# Patient Record
Sex: Male | Born: 1970
Health system: Southern US, Community
[De-identification: ages and names within clinical notes are randomized; demographics above are authoritative.]

## PROBLEM LIST (undated history)

## (undated) DIAGNOSIS — R918 Other nonspecific abnormal finding of lung field: Secondary | ICD-10-CM

## (undated) DIAGNOSIS — N200 Calculus of kidney: Secondary | ICD-10-CM

## (undated) DIAGNOSIS — R161 Splenomegaly, not elsewhere classified: Secondary | ICD-10-CM

## (undated) DIAGNOSIS — D693 Immune thrombocytopenic purpura: Secondary | ICD-10-CM

## (undated) DIAGNOSIS — A419 Sepsis, unspecified organism: Secondary | ICD-10-CM

## (undated) DIAGNOSIS — D739 Disease of spleen, unspecified: Secondary | ICD-10-CM

## (undated) HISTORY — DX: Other disorders of bilirubin metabolism: E80.6

## (undated) HISTORY — DX: Disease of spleen, unspecified: D73.9

## (undated) HISTORY — DX: Splenomegaly, not elsewhere classified: R16.1

## (undated) HISTORY — DX: Immune thrombocytopenic purpura: D69.3

## (undated) HISTORY — DX: Other nonspecific abnormal finding of lung field: R91.8

## (undated) HISTORY — DX: Calculus of kidney: N20.0

## (undated) HISTORY — DX: Sepsis, unspecified organism: A41.9

---

## 2015-12-26 DIAGNOSIS — Z23 Encounter for immunization: Secondary | ICD-10-CM | POA: Diagnosis not present

## 2016-02-25 DIAGNOSIS — K611 Rectal abscess: Secondary | ICD-10-CM | POA: Diagnosis not present

## 2016-03-03 DIAGNOSIS — R509 Fever, unspecified: Secondary | ICD-10-CM | POA: Diagnosis not present

## 2016-03-03 DIAGNOSIS — R319 Hematuria, unspecified: Secondary | ICD-10-CM | POA: Diagnosis not present

## 2016-03-03 DIAGNOSIS — D696 Thrombocytopenia, unspecified: Secondary | ICD-10-CM | POA: Diagnosis not present

## 2016-03-04 ENCOUNTER — Inpatient Hospital Stay (HOSPITAL_COMMUNITY): Payer: BLUE CROSS/BLUE SHIELD

## 2016-03-04 ENCOUNTER — Inpatient Hospital Stay (HOSPITAL_COMMUNITY)
Admission: EM | Admit: 2016-03-04 | Discharge: 2016-03-08 | DRG: 872 | Disposition: A | Discharge status: Home / Self Care | Payer: BLUE CROSS/BLUE SHIELD | Attending: Family Medicine | Admitting: Family Medicine

## 2016-03-04 ENCOUNTER — Encounter (HOSPITAL_COMMUNITY): Payer: Self-pay | Admitting: Emergency Medicine

## 2016-03-04 DIAGNOSIS — D739 Disease of spleen, unspecified: Secondary | ICD-10-CM | POA: Diagnosis not present

## 2016-03-04 DIAGNOSIS — R509 Fever, unspecified: Secondary | ICD-10-CM | POA: Diagnosis present

## 2016-03-04 DIAGNOSIS — E876 Hypokalemia: Secondary | ICD-10-CM | POA: Diagnosis not present

## 2016-03-04 DIAGNOSIS — R7881 Bacteremia: Secondary | ICD-10-CM | POA: Diagnosis not present

## 2016-03-04 DIAGNOSIS — D696 Thrombocytopenia, unspecified: Secondary | ICD-10-CM | POA: Diagnosis not present

## 2016-03-04 DIAGNOSIS — R011 Cardiac murmur, unspecified: Secondary | ICD-10-CM | POA: Diagnosis not present

## 2016-03-04 DIAGNOSIS — Z91040 Latex allergy status: Secondary | ICD-10-CM

## 2016-03-04 DIAGNOSIS — I700 Atherosclerosis of aorta: Secondary | ICD-10-CM | POA: Diagnosis present

## 2016-03-04 DIAGNOSIS — K611 Rectal abscess: Secondary | ICD-10-CM

## 2016-03-04 DIAGNOSIS — Z23 Encounter for immunization: Secondary | ICD-10-CM | POA: Diagnosis not present

## 2016-03-04 DIAGNOSIS — D693 Immune thrombocytopenic purpura: Secondary | ICD-10-CM | POA: Diagnosis not present

## 2016-03-04 DIAGNOSIS — L0231 Cutaneous abscess of buttock: Secondary | ICD-10-CM | POA: Diagnosis not present

## 2016-03-04 DIAGNOSIS — D7389 Other diseases of spleen: Secondary | ICD-10-CM | POA: Diagnosis not present

## 2016-03-04 DIAGNOSIS — A419 Sepsis, unspecified organism: Principal | ICD-10-CM

## 2016-03-04 DIAGNOSIS — N200 Calculus of kidney: Secondary | ICD-10-CM | POA: Diagnosis not present

## 2016-03-04 DIAGNOSIS — R17 Unspecified jaundice: Secondary | ICD-10-CM | POA: Diagnosis not present

## 2016-03-04 DIAGNOSIS — D61818 Other pancytopenia: Secondary | ICD-10-CM | POA: Diagnosis present

## 2016-03-04 DIAGNOSIS — R161 Splenomegaly, not elsewhere classified: Secondary | ICD-10-CM | POA: Diagnosis not present

## 2016-03-04 DIAGNOSIS — D61811 Other drug-induced pancytopenia: Secondary | ICD-10-CM | POA: Diagnosis not present

## 2016-03-04 DIAGNOSIS — R502 Drug induced fever: Secondary | ICD-10-CM | POA: Diagnosis not present

## 2016-03-04 DIAGNOSIS — R935 Abnormal findings on diagnostic imaging of other abdominal regions, including retroperitoneum: Secondary | ICD-10-CM | POA: Diagnosis not present

## 2016-03-04 DIAGNOSIS — Z880 Allergy status to penicillin: Secondary | ICD-10-CM | POA: Diagnosis not present

## 2016-03-04 DIAGNOSIS — D72819 Decreased white blood cell count, unspecified: Secondary | ICD-10-CM | POA: Diagnosis not present

## 2016-03-04 LAB — LIPASE, BLOOD: Lipase: 43 U/L (ref 11–51)

## 2016-03-04 LAB — CBC WITH DIFFERENTIAL/PLATELET
BASOS ABS: 0 10*3/uL (ref 0.0–0.1)
BASOS PCT: 0 %
EOS ABS: 0 10*3/uL (ref 0.0–0.7)
Eosinophils Relative: 1 %
HEMATOCRIT: 33.5 % — AB (ref 39.0–52.0)
HEMOGLOBIN: 11.9 g/dL — AB (ref 13.0–17.0)
Lymphocytes Relative: 11 %
Lymphs Abs: 0.4 10*3/uL — ABNORMAL LOW (ref 0.7–4.0)
MCH: 28.1 pg (ref 26.0–34.0)
MCHC: 35.5 g/dL (ref 30.0–36.0)
MCV: 79.2 fL (ref 78.0–100.0)
Monocytes Absolute: 0.4 10*3/uL (ref 0.1–1.0)
Monocytes Relative: 13 %
NEUTROS ABS: 2.3 10*3/uL (ref 1.7–7.7)
NEUTROS PCT: 75 %
Platelets: 44 10*3/uL — ABNORMAL LOW (ref 150–400)
RBC: 4.23 MIL/uL (ref 4.22–5.81)
RDW: 15 % (ref 11.5–15.5)
WBC: 3.1 10*3/uL — AB (ref 4.0–10.5)

## 2016-03-04 LAB — URINE MICROSCOPIC-ADD ON

## 2016-03-04 LAB — COMPREHENSIVE METABOLIC PANEL
ALBUMIN: 2.8 g/dL — AB (ref 3.5–5.0)
ALK PHOS: 216 U/L — AB (ref 38–126)
ALT: 56 U/L (ref 17–63)
ANION GAP: 9 (ref 5–15)
AST: 36 U/L (ref 15–41)
BILIRUBIN TOTAL: 4.8 mg/dL — AB (ref 0.3–1.2)
BUN: 13 mg/dL (ref 6–20)
CALCIUM: 8.9 mg/dL (ref 8.9–10.3)
CO2: 21 mmol/L — AB (ref 22–32)
CREATININE: 1.11 mg/dL (ref 0.61–1.24)
Chloride: 102 mmol/L (ref 101–111)
GFR calc Af Amer: >60 mL/min (ref >60)
GFR calc non Af Amer: >60 mL/min (ref >60)
GLUCOSE: 129 mg/dL — AB (ref 65–99)
Potassium: 3.9 mmol/L (ref 3.5–5.1)
SODIUM: 132 mmol/L — AB (ref 135–145)
Total Protein: 6.1 g/dL — ABNORMAL LOW (ref 6.5–8.1)

## 2016-03-04 LAB — BILIRUBIN, FRACTIONATED(TOT/DIR/INDIR)
BILIRUBIN INDIRECT: 1.4 mg/dL — AB (ref 0.3–0.9)
Bilirubin, Direct: 2.9 mg/dL — ABNORMAL HIGH (ref 0.1–0.5)
Total Bilirubin: 4.3 mg/dL — ABNORMAL HIGH (ref 0.3–1.2)

## 2016-03-04 LAB — ACETAMINOPHEN LEVEL: Acetaminophen (Tylenol), Serum: <10 ug/mL — ABNORMAL LOW (ref 10–30)

## 2016-03-04 LAB — URINALYSIS, ROUTINE W REFLEX MICROSCOPIC
GLUCOSE, UA: NEGATIVE mg/dL
KETONES UR: NEGATIVE mg/dL
Nitrite: NEGATIVE
PH: 6 (ref 5.0–8.0)
Protein, ur: 30 mg/dL — AB
Specific Gravity, Urine: 1.022 (ref 1.005–1.030)

## 2016-03-04 LAB — LACTIC ACID, PLASMA
Lactic Acid, Venous: 1.7 mmol/L (ref 0.5–1.9)
Lactic Acid, Venous: 1.3 mmol/L (ref 0.5–1.9)

## 2016-03-04 LAB — PROTIME-INR
INR: 0.97
Prothrombin Time: 12.9 seconds (ref 11.4–15.2)

## 2016-03-04 LAB — TSH: TSH: 1.115 u[IU]/mL (ref 0.350–4.500)

## 2016-03-04 LAB — I-STAT CG4 LACTIC ACID, ED
Lactic Acid, Venous: 1.53 mmol/L (ref 0.5–1.9)
Lactic Acid, Venous: 1.81 mmol/L (ref 0.5–1.9)

## 2016-03-04 MED ORDER — IBUPROFEN 400 MG PO TABS
600.0000 mg | ORAL_TABLET | Freq: Once | ORAL | Status: AC
  Administered 2016-03-04: 600 mg via ORAL
  Filled 2016-03-04: qty 1

## 2016-03-04 MED ORDER — SODIUM CHLORIDE 0.9 % IV BOLUS (SEPSIS)
1000.0000 mL | Freq: Once | INTRAVENOUS | Status: AC
  Administered 2016-03-04: 1000 mL via INTRAVENOUS

## 2016-03-04 MED ORDER — VANCOMYCIN HCL 10 G IV SOLR
1250.0000 mg | Freq: Two times a day (BID) | INTRAVENOUS | Status: DC
  Administered 2016-03-05 – 2016-03-06 (×3): 1250 mg via INTRAVENOUS
  Filled 2016-03-04 (×5): qty 1250

## 2016-03-04 MED ORDER — IBUPROFEN 400 MG PO TABS
400.0000 mg | ORAL_TABLET | Freq: Four times a day (QID) | ORAL | Status: DC | PRN
  Administered 2016-03-04 – 2016-03-05 (×2): 400 mg via ORAL
  Filled 2016-03-04 (×2): qty 1

## 2016-03-04 MED ORDER — PIPERACILLIN-TAZOBACTAM 3.375 G IVPB
3.3750 g | Freq: Three times a day (TID) | INTRAVENOUS | Status: DC
  Administered 2016-03-04 – 2016-03-06 (×5): 3.375 g via INTRAVENOUS
  Filled 2016-03-04 (×7): qty 50

## 2016-03-04 MED ORDER — ACETAMINOPHEN 325 MG PO TABS
650.0000 mg | ORAL_TABLET | Freq: Four times a day (QID) | ORAL | Status: DC | PRN
  Administered 2016-03-05: 650 mg via ORAL
  Filled 2016-03-04: qty 2

## 2016-03-04 MED ORDER — IOPAMIDOL (ISOVUE-300) INJECTION 61%
INTRAVENOUS | Status: AC
  Administered 2016-03-04: 100 mL
  Filled 2016-03-04: qty 100

## 2016-03-04 MED ORDER — PIPERACILLIN-TAZOBACTAM 3.375 G IVPB 30 MIN
3.3750 g | Freq: Once | INTRAVENOUS | Status: AC
  Administered 2016-03-04: 3.375 g via INTRAVENOUS
  Filled 2016-03-04: qty 50

## 2016-03-04 MED ORDER — SODIUM CHLORIDE 0.9 % IV BOLUS (SEPSIS)
500.0000 mL | Freq: Once | INTRAVENOUS | Status: AC
  Administered 2016-03-04: 500 mL via INTRAVENOUS

## 2016-03-04 MED ORDER — VANCOMYCIN HCL IN DEXTROSE 1-5 GM/200ML-% IV SOLN
1000.0000 mg | Freq: Once | INTRAVENOUS | Status: AC
  Administered 2016-03-04: 1000 mg via INTRAVENOUS
  Filled 2016-03-04: qty 200

## 2016-03-04 NOTE — ED Notes (Signed)
Spoke with main lab about adding on the hepatitis panel they are able to do so

## 2016-03-04 NOTE — ED Notes (Signed)
Patient transported to CT 

## 2016-03-04 NOTE — H&P (Signed)
[facility] Admission History and Physical Service Pager: [phone]  Patient name: Joshua Wagner Medical record number: [id number] Date of birth: 02-20-1971 Age: 45 y.o. Gender: male  Primary Care Provider: No PCP Per Patient Consultants: None Code Status: FULL  Chief Complaint: Fever and Jaundice  Assessment and Plan: Joshua Wagner is a 45 y.o. male presenting with fever and jaundice . PMH is significant for thrombocytopenia (has been to an oncologist and had a bone marrow biopsy done 12-13 years ago, but none of the tests came back positive).   #Sepsis, unknown origin: Onset 1 week ago with max temperature 103F. Patient states he had a gluteal abscess drained 1 week ago at [facility] clinic and has been having the packing replaced every other day with minimal red discharge from site. On exam, the abscess is open and packed, but is not draining any material. The surrounding skin is not erythematous or warm and there is no fluctuance present. There were no signs of active infection, but he could have potentially developed a tract. Patient endorses chills and dyspnea at home. Denies nausea, chest pain, abdominal pain, unintentional weight loss, change in appetite, change in bowel or bladder frequency or consistency. Was taking Advil which helped lower fever at home. Taking Bactrim for 1 week. Pt has been having dyspnea occasional dyspnea, but he denies cough and has a normal lung exam, so pneumonia is less likely. He denies dysuria and urinary frequency, so UTI unlikely. His UA did show few bacteria and small LE, but had 0-5 squams so was likely not a clean catch. Unsure if he has some type of strange intra-abdominal abscess? On arrival to the floor, he was meeting 3/4 SIRS criteria with a temperature of 103F, HR 100, and WBC 3.1.  --Admitted to observation on med-surg, attending Dr. Nori Riis  --Blood cultures obtained, will initiate broad spectrum abx with vancomycin and  zosyn  --F/u BCx -- Will order CT abdomen/pelvis to rule out abscess tract vs other intra-abdominal abscess or infection --CBC and CMET --Vitals qshift --Ibuprofen 400 mg q6h PRN mild-moderate pain  #Direct Hyperbilirubinemia, Acute, Unknown Etiology: Bili levels tot 4.3, direct 2.9, indirect 1.4 upon admission. Alk phos elevated 216 without transaminitis. Lipase wnl. Patient has h/o thrombocytosis and was seen by hematologist approximately 12 years ago when he lived in [place]. He states he underwent an extensive work up, including bone marrow biopsy, but they were unable to determine source. He denies h/o jaundice in past or family history of jaundice. Patient without abdominal pain, nausea, or change in bowel/bladder function. No hepatosplenomegaly appreciated but abdomen mildly distended. Denies h/o illict drug use, travel, or eating exotic foods. No sick contacts. No h/o abdominal/pelvic surgery. Possible differentials include primary biliary cholangitis, primary sclerosing cholangitis, viral hepatitis, Dubin-Johnson.  --Will obtain abdominal U/S to rule out hepatic or biliary abnormalities --Will obtain CT abdomen and pelvis with contrast --HIV screen pending --Hepatitis panel pending --TSH pending  #Abscess of Gluteal Crease, Healing, Stable: Has history of previous gluteal abscess many years ago which spontaneously resolved. The current abscess was drained 1 week ago at [facility] clinic with continual repacking and Bactrim. No known h/o Crohns disease. No drainage or signs of infection at site with clean, healing incision. No surrounding erythema, warmth, or fluctuance. Given the location of the abscess, it is possible that he has developed a tract. --Repeat CBC tomorrow morning --Wound care consult  #Pancytopenia WBC 3.1, Hgb 11.9, Plt 44 during admission with decrease in absolute lymphs.  H/o thrombocytopenia, see above. Uncertain chronicity of this given patient does not see a  physician regularly and has not seen hematology in 12 years. --CBC and CMET  FEN/GI: Regular diet, saline lock Prophylaxis: SCDs  Disposition: Admit to FPTS for obs. Pending improvement of fever and source of jaundice.  History of Present Illness:  Joshua Wagner is a 45 y.o. male presenting with fever and jaundice. PMH is significant for thrombocytopenia (has been to an oncologist and had a bone marrow biopsy done 12-13 years ago, but none of the tests came back positive).   Patient had a rectal abscess drained 1 week ago at [facility] walk-in clinic. The day after the I&D, he started having fevers to 102.5. He has had fevers every day since then. He has been taking Advil and Dayquil. He has been seen at the [facility] walk-in clinic every other day for wound checks and everything has looked fine. He has been taking Bactrim for the abscess. He has been taking some gas-x for stomach cramps after starting the Bactrim. He endorses chills and occasional SOB (not currently short of breath). No chest pain. No nausea. Having regular BMs every other day. No hematochezia. His urine has been orange in color. No dysuria, no urinary frequency. No recent travel. Hasn't eaten anything unusual. No one else is sick at home.  In the ED, he was afebrile with a normal HR, RR, and BP. WBC 3.1, Platelets 44, total bilirubin was 4.8, AST 36, ALT 56. He was noted to be jaundiced with scleral icterus. He was admitted for further management.  Review Of Systems: complete ROS performed, see HPI for pertinent  Review of Systems  Constitutional: Positive for chills and fever. Negative for weight loss.  HENT: Negative for congestion.   Eyes: Negative for blurred vision and double vision.  Respiratory: Positive for shortness of breath. Negative for cough.   Cardiovascular: Negative for chest pain and leg swelling.  Gastrointestinal: Negative for constipation, diarrhea, nausea and vomiting.  Genitourinary: Negative for dysuria and  frequency.  Musculoskeletal: Negative for back pain and myalgias.  Skin: Negative for rash.  Neurological: Positive for weakness. Negative for dizziness, focal weakness and headaches.    There are no active problems to display for this patient.   Past Medical History: Thrombocytopenia  Past Surgical History: History reviewed. No pertinent surgical history.  Social History: Social History  Substance Use Topics  . Smoking status: Never Smoker  . Smokeless tobacco: Never Used  . Alcohol use No   Additional social history: Lives at home with wife and two kids. He is a Pharmacist, community.  Please also refer to relevant sections of EMR.  Family History: Mother- diabetes Father- diabetes Brother- diabetes  Allergies and Medications: Allergies  Allergen Reactions  . Penicillins   . Latex Rash   No current facility-administered medications on file prior to encounter.    No current outpatient prescriptions on file prior to encounter.    Objective: BP 130/89 (BP Location: Left Arm)   Pulse 80   Temp 98.7 F (37.1 C) (Oral)   Resp 16   SpO2 98%  Exam: General: well nourished, well developed, in no acute distress with non-toxic appearance HEENT: normocephalic, atraumatic, moist mucous membranes, scleral icterus Neck: supple, non-tender without lymphadenopathy CV: regular rate and rhythm without murmurs, rubs, or gallops Lungs: clear to auscultation bilaterally with normal work of breathing Abdomen: soft, non-tender, mildly distended, no masses or organomegaly palpable, normoactive bowel sounds Skin: jaundice, warm, dry, no rashes or lesions,  cap refill < 2 seconds Extremities: warm and well perfused, normal tone Rectal: packing in place on right buttocks with healing drained abscess without edema or erythema and without drainage  Labs and Imaging: CBC BMET   Recent Labs Lab 03/04/16 1227  WBC 3.1*  HGB 11.9*  HCT 33.5*  PLT 44*    Recent Labs Lab 03/04/16 1227  NA  132*  K 3.9  CL 102  CO2 21*  BUN 13  CREATININE 1.11  GLUCOSE 129*  CALCIUM 8.9     Bilirubin     Component Value Date/Time   BILITOT 4.3 (H) 03/04/2016 1406   BILIDIR 2.9 (H) 03/04/2016 1406   IBILI 1.4 (H) 03/04/2016 1406   UA: amber, large Hgb and bili, 30 protein, small leuks Urine micro: Ca ox crystals with granular casts and few bacteria Lipase: 43 Acetaminophen: wnl Lactic acid: 1.53 BCx: pend Hepatitis: pend PT/INR: 12.9/0.97 HIV: pend TSH: pend  US Abdomen Complete (pending)  CT Abdomen and Pelvis (pending)  [place] Bing, DO 03/04/2016, 4:18 PM PGY-1, [place] Intern pager: [phone], text pages welcome   FPTS Upper-Level Resident Addendum  I have independently interviewed and examined the patient. I have discussed the above with the original author and agree with their documentation. My edits for correction/addition/clarification are in blue. Please see also any attending notes.   Hyman Bible, MD PGY-2, [place] Service pager: [phone] (text pages welcome through [place])

## 2016-03-04 NOTE — ED Triage Notes (Signed)
Pt sts abscess on buttocks area that has been I/D ed with increasing fever and not feeling well

## 2016-03-04 NOTE — ED Notes (Signed)
Spoke with admitting MD about pt's temp, will continue to monitor

## 2016-03-04 NOTE — Progress Notes (Signed)
Pt arrived on unit from ED. Pt in stable condition. Will continue to monitor.

## 2016-03-04 NOTE — Progress Notes (Signed)
Pharmacy Antibiotic Note Joshua NumberKamran Wagner is a 45 y.o. male admitted on 03/04/2016 with fever and jaundice in setting of pancytopenia.  Pharmacy has been consulted for Zosyn and vancomycin dosing.  Plan: 1. Vancomycin 1250 IV every 12 hours.  Goal trough 15-20 mcg/mL. 2. Zosyn 3.375g IV q8h (4 hour infusion).  3. Pt reports itching over last few weeks while taking a few different courses of antibiotics and NSAIDs and is not aware of any direct allergy to penicillin agents so will give trial of Zosyn and see if tolerates  Weight: 180 lb (81.6 kg)  Temp (24hrs), Avg:99.9 F (37.7 C), Min:97.7 F (36.5 C), Max:103.2 F (39.6 C)   Recent Labs Lab 03/04/16 1227 03/04/16 1241 03/04/16 1542  WBC 3.1*  --   --   CREATININE 1.11  --   --   LATICACIDVEN  --  1.53 1.81    CrCl cannot be calculated (Unknown ideal weight.).    Allergies  Allergen Reactions  . Penicillins   . Latex Rash    Antimicrobials this admission: 11/3 Zosyn >>  11/3 vancomycin >>   Dose adjustments this admission: n/a  Microbiology results: 11/3 BCx: px 11/3 UCx: px    Thank you for allowing pharmacy to be a part of this patient's care.  Pollyann SamplesAndy Jaloni Sorber, PharmD, BCPS 03/04/2016, 6:09 PM Pager: [phone]

## 2016-03-04 NOTE — ED Provider Notes (Signed)
[facility]-EMERGENCY DEPT Provider Note   CSN: [id number] Arrival date & time: 03/04/16  1004     History   Chief Complaint Chief Complaint  Patient presents with  . Abscess    HPI Joshua Wagner is a 45 y.o. male.  The history is provided by the patient. No language interpreter was used.  Abscess  Location:  Torso Torso abscess location:  Lower back Abscess quality: draining   Red streaking: no   Duration:  1 week Progression:  Worsening Chronicity:  New Relieved by:  Nothing Worsened by:  Nothing Ineffective treatments:  None tried Associated symptoms: fever   Associated symptoms: no vomiting   Risk factors: no prior abscess    Pt reports he had an I and d of a rectal abscess 1 week ago.  Pt hs been sick since abscess began.  Pt had wound rechecked in last pm.  Wound was repacked.  Pt reports he has been having fevers and feels sick.   History reviewed. No pertinent past medical history.  There are no active problems to display for this patient.   History reviewed. No pertinent surgical history.     Home Medications    Prior to Admission medications   Medication Sig Start Date End Date Taking? Authorizing Provider  ibuprofen (ADVIL,MOTRIN) 800 MG tablet Take 800 mg by mouth every 8 (eight) hours as needed for fever or mild pain.   Yes Historical Provider, MD  simethicone (GAS-X) 80 MG chewable tablet Chew 80 mg by mouth every 6 (six) hours as needed for flatulence.   Yes Historical Provider, MD  sulfamethoxazole-trimethoprim (BACTRIM DS,SEPTRA DS) 800-160 MG tablet Take 1 tablet by mouth 2 (two) times daily. Started on 02-25-16 for 10 days 02/25/16  Yes Historical Provider, MD    Family History History reviewed. No pertinent family history.  Social History Social History  Substance Use Topics  . Smoking status: Never Smoker  . Smokeless tobacco: Never Used  . Alcohol use No     Allergies   Penicillins and Latex   Review of Systems Review of Systems    Constitutional: Positive for fever.  Gastrointestinal: Negative for vomiting.  All other systems reviewed and are negative.    Physical Exam Updated Vital Signs BP 133/92 (BP Location: Left Arm)   Pulse 75   Temp 97.7 F (36.5 C) (Oral)   Resp 16   SpO2 98%   Physical Exam  Constitutional: He appears well-developed and well-nourished.  HENT:  Head: Normocephalic and atraumatic.  Nose: Nose normal.  Mouth/Throat: Oropharynx is clear and moist.  Eyes: EOM are normal. Pupils are equal, round, and reactive to light. Scleral icterus is present.  Neck: Neck supple.  Cardiovascular: Normal rate and regular rhythm.   No murmur heard. Pulmonary/Chest: Effort normal and breath sounds normal. No respiratory distress.  Abdominal: Soft. There is no tenderness.  Musculoskeletal: He exhibits no edema.  Incision buttock  Packed,  No induration  No drainage  Neurological: He is alert.  Skin: Skin is dry.  jaundace  Psychiatric: He has a normal mood and affect.  Nursing note and vitals reviewed.    ED Treatments / Results  Labs (all labs ordered are listed, but only abnormal results are displayed) Labs Reviewed  CBC WITH DIFFERENTIAL/PLATELET - Abnormal; Notable for the following:       Result Value   WBC 3.1 (*)    Hemoglobin 11.9 (*)    HCT 33.5 (*)    Platelets 44 (*)  Lymphs Abs 0.4 (*)    All other components within normal limits  COMPREHENSIVE METABOLIC PANEL - Abnormal; Notable for the following:    Sodium 132 (*)    CO2 21 (*)    Glucose, Bld 129 (*)    Total Protein 6.1 (*)    Albumin 2.8 (*)    Alkaline Phosphatase 216 (*)    Total Bilirubin 4.8 (*)    All other components within normal limits  ACETAMINOPHEN LEVEL - Abnormal; Notable for the following:    Acetaminophen (Tylenol), Serum <10 (*)    All other components within normal limits  CULTURE, BLOOD (ROUTINE X 2)  CULTURE, BLOOD (ROUTINE X 2)  LIPASE, BLOOD  URINALYSIS, ROUTINE W REFLEX MICROSCOPIC (NOT  [facility])  HEPATITIS PANEL, ACUTE  I-STAT CG4 LACTIC ACID, ED    EKG  EKG Interpretation None       Radiology No results found.  Procedures Procedures (including critical care time)  Medications Ordered in ED Medications  sodium chloride 0.9 % bolus 1,000 mL (not administered)     Initial Impression / Assessment and Plan / ED Course  I have reviewed the triage vital signs and the nursing notes.  Pertinent labs & imaging results that were available during my care of the patient were reviewed by me and considered in my medical decision making (see chart for details).  Clinical Course    Dr. Madilyn Hookees in to see and examine.  Pt has elevated bilirubin.  Platelets low at 44.   I discussed with Family Practice resident.  Pt admitted to Dr. Jennette KettleNeal for further evaluation.  Final Clinical Impressions(s) / ED Diagnoses   Final diagnoses:  Hyperbilirubinemia  Thrombocytopenia, idiopathic (HCC)    New Prescriptions New Prescriptions   No medications on file     Elson AreasLeslie K Adrianna Dudas, PA-C 03/04/16 1445    Tilden FossaElizabeth Rees, MD 03/06/16 1521

## 2016-03-04 NOTE — ED Notes (Signed)
Patient states he took four OTC Advil at approximately 0630 this morning.

## 2016-03-04 NOTE — Progress Notes (Signed)
Interim Rounding note  Patient seen to discuss findings on CT abd/pelvis. He continues to deny IV drug use or any type of drug use, rectal trauma or rectal intercourse. He denies any behaviors placing him at risk for HIV infection and has been tested for it within the last 10 years after a possible exposure during a dental procedure he was performing. This is his third rectal abscess. He denies a family or personal history of IBD  BP 127/70 (BP Location: Left Arm)   Pulse 95   Temp 99.7 F (37.6 C) (Oral)   Resp 16   Ht 5\' 10"  (1.778 m)   Wt 181 lb (82.1 kg)   SpO2 98%   BMI 25.97 kg/m   Exam Cardiac: RRR, 3/6 systolic murmur heard best over right and left upper sternal borders  A/P 45 y/o M presenting for fevers with newly found splenic lesion concerning for abscesses, previously meeting SIRS criteria but now improved vitals, heart murmur on exam  Splenic lesions- differential included abscesses vs  infarcts vs infiltrative disease. Given infectious picture on presentation, his lesions are likley secondary to infection. Unclear what has caused them, potentially seeding from bacteremia from rectal abscess vs endocarditis.  -Will continue vanc/zosyn -f/u BCx and repeat in 24-48 hr - echo cardiogram - will likely need TEE if TTE negative, will call cards in that case  Heart Murmur- patient reports he has had a murmur since childhood, however given we do not have documentation of this will need to evaluate - echo as above  Kalle Bernath A. Kennon RoundsHaney MD, MS Family Medicine Resident PGY-2 Pager [phone]

## 2016-03-05 DIAGNOSIS — A419 Sepsis, unspecified organism: Principal | ICD-10-CM

## 2016-03-05 DIAGNOSIS — R17 Unspecified jaundice: Secondary | ICD-10-CM

## 2016-03-05 DIAGNOSIS — K611 Rectal abscess: Secondary | ICD-10-CM

## 2016-03-05 DIAGNOSIS — D693 Immune thrombocytopenic purpura: Secondary | ICD-10-CM

## 2016-03-05 DIAGNOSIS — R161 Splenomegaly, not elsewhere classified: Secondary | ICD-10-CM

## 2016-03-05 DIAGNOSIS — D61811 Other drug-induced pancytopenia: Secondary | ICD-10-CM

## 2016-03-05 LAB — COMPREHENSIVE METABOLIC PANEL
ALBUMIN: 2.2 g/dL — AB (ref 3.5–5.0)
ALT: 49 U/L (ref 17–63)
AST: 34 U/L (ref 15–41)
Alkaline Phosphatase: 189 U/L — ABNORMAL HIGH (ref 38–126)
Anion gap: 5 (ref 5–15)
BILIRUBIN TOTAL: 3.9 mg/dL — AB (ref 0.3–1.2)
CHLORIDE: 105 mmol/L (ref 101–111)
CO2: 24 mmol/L (ref 22–32)
CREATININE: 1.07 mg/dL (ref 0.61–1.24)
Calcium: 8.3 mg/dL — ABNORMAL LOW (ref 8.9–10.3)
GFR calc Af Amer: >60 mL/min (ref >60)
GLUCOSE: 94 mg/dL (ref 65–99)
Potassium: 3.9 mmol/L (ref 3.5–5.1)
Sodium: 134 mmol/L — ABNORMAL LOW (ref 135–145)
Total Protein: 5.4 g/dL — ABNORMAL LOW (ref 6.5–8.1)

## 2016-03-05 LAB — IRON AND TIBC
IRON: 35 ug/dL — AB (ref 45–182)
SATURATION RATIOS: 13 % — AB (ref 17.9–39.5)
TIBC: 267 ug/dL (ref 250–450)
UIBC: 232 ug/dL

## 2016-03-05 LAB — CBC
HEMATOCRIT: 29.7 % — AB (ref 39.0–52.0)
Hemoglobin: 10.2 g/dL — ABNORMAL LOW (ref 13.0–17.0)
MCH: 27.1 pg (ref 26.0–34.0)
MCHC: 34.3 g/dL (ref 30.0–36.0)
MCV: 79 fL (ref 78.0–100.0)
PLATELETS: 52 10*3/uL — AB (ref 150–400)
RBC: 3.76 MIL/uL — ABNORMAL LOW (ref 4.22–5.81)
RDW: 15.1 % (ref 11.5–15.5)
WBC: 2.8 10*3/uL — AB (ref 4.0–10.5)

## 2016-03-05 LAB — HEPATITIS PANEL, ACUTE
HCV Ab: <0.1 s/co ratio (ref 0.0–0.9)
HEP A IGM: NEGATIVE
HEP B C IGM: NEGATIVE
Hepatitis B Surface Ag: NEGATIVE

## 2016-03-05 LAB — RETICULOCYTES
RBC.: 3.73 MIL/uL — ABNORMAL LOW (ref 4.22–5.81)
Retic Count, Absolute: 52.2 10*3/uL (ref 19.0–186.0)
Retic Ct Pct: 1.4 % (ref 0.4–3.1)

## 2016-03-05 LAB — HIV ANTIBODY (ROUTINE TESTING W REFLEX): HIV Screen 4th Generation wRfx: NONREACTIVE

## 2016-03-05 LAB — FERRITIN: FERRITIN: 97 ng/mL (ref 24–336)

## 2016-03-05 MED ORDER — INFLUENZA VAC SPLIT QUAD 0.5 ML IM SUSY: 0.5000 mL | PREFILLED_SYRINGE | INTRAMUSCULAR | Status: DC

## 2016-03-05 MED ORDER — SIMETHICONE 80 MG PO CHEW
80.0000 mg | CHEWABLE_TABLET | Freq: Four times a day (QID) | ORAL | Status: DC | PRN
  Administered 2016-03-05: 80 mg via ORAL
  Filled 2016-03-05: qty 1

## 2016-03-05 MED ORDER — SIMETHICONE 80 MG PO CHEW
160.0000 mg | CHEWABLE_TABLET | Freq: Four times a day (QID) | ORAL | Status: DC | PRN
  Administered 2016-03-05: 160 mg via ORAL
  Filled 2016-03-05: qty 2

## 2016-03-05 NOTE — Progress Notes (Signed)
Patient felt as if his temperature was climbing.  He also began humming.  Recheck showed a temp of 103.  Vanc hung, tylenol given. Will continue to monitor.

## 2016-03-05 NOTE — Progress Notes (Signed)
Family Medicine Teaching Service Daily Progress Note Intern Pager: [phone]  Patient name: Joshua Wagner Medical record number: [id number] Date of birth: 05-Jan-1971 Age: 45 y.o. Gender: male  Primary Care Provider: No PCP Per Patient Consultants: None Code Status: FULL  Pt Overview and Major Events to Date:  11/03: Admit for FUO and direct hyperbilirubinemia, CT and U/S showed splenic lesion concerning for abscesses  Assessment and Plan: Joshua Wagner is a 45 y.o. male presenting with fever and jaundice . PMH is significant for thrombocytopenia (has been to an oncologist and had a bone marrow biopsy done 12-13 years ago, but none of the tests came back positive).   #Sepsis, Unknown Origin, Likely Spleen Given Imaging: Onset 1 week ago with max temperature 103F. Patient states he had a gluteal abscess drained 1 week ago at [facility] clinic and has been having the packing replaced every other day with minimal red discharge from site. On exam, the abscess is open and packed, but is not draining any material. The surrounding skin is not erythematous or warm and there is no fluctuance present. There were no signs of active infection, but he could have potentially developed a tract. Patient endorses chills and dyspnea at home. Denies nausea, chest pain, abdominal pain, unintentional weight loss, change in appetite, change in bowel or bladder frequency or consistency. Was taking Advil which helped lower fever at home. Taking Bactrim for 1 week. Pt has been having dyspnea occasional dyspnea, but he denies cough and has a normal lung exam, so pneumonia is less likely. He denies dysuria and urinary frequency, so UTI unlikely. His UA did show few bacteria and small LE, but had 0-5 squams so was likely not a clean catch. Unsure if he has some type of strange intra-abdominal abscess? On arrival to the floor, he was meeting 3/4 SIRS criteria with a temperature of 103F, HR 100, and WBC 3.1. Abd/pelvic CT and  U/S is concerning for splenic abscesses with uncertain source. Patient has heart murmur concerning for possible etiology but patient states his murmur is not new. --Initial BCx NGTD, will obtain BCx again after 24 hrs given fever overnight, on vanc/zosyn --Vancomycin IV and Zosyn IV (day 2) --TTE pending, if neg, may need Cards consult for consideration of TEE --Trend CBC and CMET --Vitals qshift --Tylenol 650 mg q6h and Ibuprofen 400 mg q6h PRN mild-moderate pain  #Direct Hyperbilirubinemia, Acute, Unknown Etiology: Bili levels tot 4.3, direct 2.9, indirect 1.4 upon admission. Alk phos elevated 216 without transaminitis. Lipase wnl. Patient has h/o thrombocytosis and was seen by hematologist approximately 12 years ago when he lived in [place]. He states he underwent an extensive work up, including bone marrow biopsy, but they were unable to determine source. He denies h/o jaundice in past or family history of jaundice. Patient without abdominal pain, nausea, or change in bowel/bladder function. No hepatosplenomegaly appreciated but abdomen mildly distended. Denies h/o illict drug use, travel, or eating exotic foods. No sick contacts. No h/o abdominal/pelvic surgery. Possible differentials include primary biliary cholangitis, primary sclerosing cholangitis, viral hepatitis, Dubin-Johnson. CT and U/S concerning for splenic abscesses, see not above. No biliary ductal dilation of liver pathology noted. Hep panel and HIV neg. Liver enzymes wnl, alk phos trending down 216>189 and direct bili trending down 4.3>3.9 --F/u bili levels --Work up for source of splenic abscesses  #Abscess of Gluteal Crease, Healing, Stable: Has history of previous gluteal abscess many years ago which spontaneously resolved. The current abscess was drained 1 week ago at [facility] clinic with continual repacking and Bactrim. No known h/o Crohns disease. No drainage or signs of infection at site with clean, healing incision.  No surrounding erythema, warmth, or fluctuance. Given the location of the abscess, it is possible that he has developed a tract. --Trend CBC --Wound care consulted  #Pancytopenia WBC 3.1, Hgb 11.9, Plt 44 during admission with decrease in absolute lymphs. H/o thrombocytopenia, see above. Uncertain chronicity of this given patient does not see a physician regularly and has not seen hematology in 12 years. Plt increasing 44>52, WBC and Hgb still decreasing 2.8 and 10.2 respectively. --CBC and CMET  FEN/GI: Regular diet, saline lock Prophylaxis: SCDs  Disposition: Pending improvement of FUO, direct hyperbilirubinemia, and source of splenic abscesses.  Subjective:  Overnight spiked fever 103F with chills but resolved with tylenol and ibuprofen. Afebrile and stable this AM accompanied by his wife. Patient is comfortable and denies CP, SOB, nausea, abdominal pain, change in bowel or bladder habits. Patient informed of findings and understanding of situation and need for further work up for undetermined source of splenic abscesses.  Objective: Temp:  [97.7 F (36.5 C)-103.2 F (39.6 C)] 98.8 F (37.1 C) (11/04 0507) Pulse Rate:  [71-106] 72 (11/04 0507) Resp:  [16-19] 19 (11/04 0507) BP: (101-155)/(63-105) 101/64 (11/04 0507) SpO2:  [95 %-100 %] 98 % (11/04 0507) Weight:  [180 lb (81.6 kg)-181 lb (82.1 kg)] 181 lb (82.1 kg) (11/03 1851) Physical Exam: General: well nourished, well developed, in no acute distress with non-toxic appearance HEENT: normocephalic, atraumatic, moist mucous membranes, scleral icterus Neck: supple, non-tender without lymphadenopathy CV: regular rate and rhythm, holosystolic murmur, rubs, or gallops Lungs: clear to auscultation bilaterally with normal work of breathing Abdomen: soft, non-tender, no masses or organomegaly palpable, normoactive bowel sounds Skin: warm, dry, jaundice, cap refill < 2 seconds Extremities: warm and well perfused, normal  tone  Laboratory:  Recent Labs Lab 03/04/16 1227  WBC 3.1*  HGB 11.9*  HCT 33.5*  PLT 44*    Recent Labs Lab 03/04/16 1227 03/04/16 1406  NA 132*  --   K 3.9  --   CL 102  --   CO2 21*  --   BUN 13  --   CREATININE 1.11  --   CALCIUM 8.9  --   PROT 6.1*  --   BILITOT 4.8* 4.3*  ALKPHOS 216*  --   ALT 56  --   AST 36  --   GLUCOSE 129*  --    UA: amber, large Hgb and bili, 30 protein, small leuks Urine micro: Ca ox crystals with granular casts and few bacteria Lipase: 43 Acetaminophen: wnl Lactic acid: 1.53 BCx: NGTD Hepatitis: neg PT/INR: 12.9/0.97 HIV: neg TSH: wnl  Imaging/Diagnostic Tests: CT ABDOMEN PELVIS W CONTRAST (03/04/16) FINDINGS: Lower Chest: No acute findings.  Hepatobiliary: No mass identified. Gallbladder is unremarkable. No evidence of biliary dilatation.  Pancreas:  No mass or inflammatory changes.  Spleen: Mild to moderate splenomegaly, with spleen measuring 16 cm in length. Numerous small ill-defined low-attenuation lesions are seen throughout the splenic parenchyma.  Adrenals/Urinary Tract: No masses identified. No evidence of hydronephrosis. Several tiny 2-3 mm nonobstructive right renal calculi are identified. No evidence of ureteral calculi or dilatation. Unopacified urinary bladder is unremarkable in appearance.  Stomach/Bowel: No evidence of obstruction, inflammatory process or abnormal fluid collections. Normal appendix visualized.  Vascular/Lymphatic: No pathologically enlarged lymph nodes. No abdominal aortic aneurysm. Aortic atherosclerosis.  Reproductive:  No mass identified.  Other:  None.  Musculoskeletal:  No suspicious bone lesions identified.  IMPRESSION: Splenomegaly with diffuse involvement by small ill-defined low-attenuation lesions. Differential diagnosis includes splenic abscesses/infectious etiologies, lymphoproliferative disorder, sarcoidosis, and metastatic disease. No other soft tissue  masses or lymphadenopathy within the abdomen or pelvis. Recommend correlation for clinical or laboratory signs of infectious etiologies and lymphoproliferative disorder.  Nonobstructive right nephrolithiasis. No evidence of ureteral calculi or hydronephrosis.  US Abdomen Complete (03/04/16) FINDINGS: Gallbladder: Gallbladder is contracted. The patient reports not being NPO with the study. Sludge is noted within the gallbladder, and the wall is difficult to visualize.  Common bile duct: Diameter: Normal caliber, 6 mm. The distal duct is obscured by overlying bowel gas.  Liver: Mildly increased echotexture diffusely suggesting fatty infiltration. No biliary ductal dilatation or focal abnormality.  IVC: No abnormality visualized.  Pancreas: Visualized portion unremarkable.  Spleen: Upper limits normal in craniocaudal length of 12.4 cm. Heterogeneous echotexture. There is concern for numerous hyperechoic small nodules throughout the spleen.  Right Kidney: Length: 11.3 cm. Echogenicity within normal limits. No mass or hydronephrosis visualized.  Left Kidney: Length: 12.3 cm. Echogenicity within normal limits. No mass or hydronephrosis visualized.  Abdominal aorta: No aneurysm visualized.  Other findings: Prominent lymph nodes are noted in the porta hepatis region, measuring up to 2.7 cm.  IMPRESSION: Mildly increased echotexture throughout the liver suggesting fatty infiltration. No biliary duct dilatation.  Gallbladder is contracted.  Sludge is noted within the gallbladder.  Heterogeneous echotexture throughout a borderline size spleen with apparent scattered numerous small hyperechoic lesions throughout the spleen. This could be further evaluated with contrast-enhanced CT to confirm and further evaluate.  DG Chest Port 1 View (03/04/16) FINDINGS: The heart size and mediastinal contours are within normal limits. Both lungs are clear. The visualized  skeletal structures are unremarkable.  IMPRESSION: No active disease.    [place] Bing, DO 03/05/2016, 7:03 AM PGY-1, [place] Intern pager: [phone], text pages welcome

## 2016-03-05 NOTE — Progress Notes (Signed)
Wound dressing changed and packing done per wound care orders. Patient tolerated well. Dressing changed and is now clean, dry, and intact. Will continue to monitor.  Joshua CarboAubrey  [name]

## 2016-03-05 NOTE — Progress Notes (Signed)
Patient requested motrin.  As I came into the room, patient was shivering, had a temp of 102..  Motrin was given,removed all but one blanket and put a cool cloth on his his.  Paged intern and gave her update and she said to keep monitoring his vitals throughout the night. Patient is beginning to feel better at this time.  Will continue to monitor.

## 2016-03-05 NOTE — Consult Note (Signed)
Referral MD  Reason for Referral: Pancytopenia with splenomegaly; fever, elevated bilirubin   Chief Complaint  Patient presents with  . Abscess  : Has had a temperature. I have had a rectal abscess.  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 He lives in [place].  He has been here for 10 years. He was in [place]. He lived in [place]. He was found to have thrombocytopenia back when he was in [place]. He says they cannot tell what caused the thrombocytopenia. He said he did have a bone marrow test.  He has been in the hospital for several days. He had a temperature 103. He has a history of rectal abscess. He probably had this drained. I am not sure if any cultures were taken.   He had temperature of 103. He had a CT scan when he came in. This showed splenomegaly with multiple small well-defined low-attenuation lesions. The radiologist was not sure what these were.. When he came in, his bilirubin was 4.3 with a direct of 2.9. His bilirubin now is 3.9.  They CT scan did not show any obvious hepatic lesions. There were no gallstones. He had no cirrhosis. There is no adenopathy.  When he was admitted, his white cell count 3.1. Hemoglobin 11.9. Platelet count 44,000. MCV was 79. Today, his white cell counts 2.8. Hemoglobin 10.2. Platelet count 52,000. MCV was 79. He had a normal white cell differential.  He does not smoke. He does not drink. He hasn't has for HIV which was negative. He's been tested for hepatitis which has been negative. He's had no foreign travel. He's not been back to [place]. He's not been around anybody she's been sick. He's had no change in medications.  He really does not see a doctor.  Is no family history of any kind of blood problems. There is no history of cancer in the family.  Says he's lost about 30 pounds over the past year but he is trying to lose weight.  Has not noted any swollen lymph nodes. He's had no rashes.  He's had no bleeding or bruising.   I must say, that he does look quite good. I would think that his overall performance status is ECOG 1.   History reviewed. No pertinent past medical history.:  History reviewed. No pertinent surgical history.:   Current Facility-Administered Medications:  .  acetaminophen (TYLENOL) tablet 650 mg, 650 mg, Oral, Q6H PRN, Howard Pouch, MD, 650 mg at 03/05/16 0102 .  ibuprofen (ADVIL,MOTRIN) tablet 400 mg, 400 mg, Oral, Q6H PRN, Campbell Stall, MD, 400 mg at 03/05/16 0551 .  [START ON 03/06/2016] Influenza vac split quadrivalent PF (FLUARIX) injection 0.5 mL, 0.5 mL, Intramuscular, Tomorrow-1000, Nestor Ramp, MD .  piperacillin-tazobactam (ZOSYN) IVPB 3.375 g, 3.375 g, Intravenous, Q8H, Nestor Ramp, MD, 3.375 g at 03/05/16 1305 .  simethicone (MYLICON) chewable tablet 80 mg, 80 mg, Oral, Q6H PRN, Elmon Else McMullen, DO, 80 mg at 03/05/16 1145 .  vancomycin (VANCOCIN) 1,250 mg in sodium chloride 0.9 % 250 mL IVPB, 1,250 mg, Intravenous, Q12H, Nestor Ramp, MD, 1,250 mg at 03/05/16 1246:  . [START ON 03/06/2016] Influenza vac split quadrivalent PF  0.5 mL Intramuscular Tomorrow-1000  . piperacillin-tazobactam (ZOSYN)  IV  3.375 g Intravenous Q8H  . vancomycin  1,250 mg Intravenous Q12H  :  Allergies  Allergen Reactions  . Penicillins   . Latex Rash  :  History reviewed. No pertinent family history.:  Social History   Social History  . Marital status: Married    Spouse name: N/A  . Number of children: N/A  . Years of education: N/A   Occupational History  . Not on file.   Social History Main Topics  . Smoking status: Never Smoker  . Smokeless tobacco: Never Used  . Alcohol use No  . Drug use: No  . Sexual activity: Not on file   Other Topics Concern  . Not on file   Social History Narrative  . No narrative on file  :  Pertinent items are noted in HPI.  Exam: Patient Vitals for the past 24 hrs:  BP Temp Temp src Pulse Resp SpO2 Height  Weight  03/05/16 1321 115/71 98 F (36.7 C) Oral 79 16 99 % - -  03/05/16 0507 101/64 98.8 F (37.1 C) Oral 72 19 98 % - -  03/05/16 0204 115/63 99.5 F (37.5 C) Oral 98 19 97 % - -  03/05/16 0055 - (!) 103 F (39.4 C) Oral (!) 106 - 96 % - -  03/04/16 2355 - (!) 102 F (38.9 C) Oral - - - - -  03/04/16 2315 112/72 - - 71 - 95 % - -  03/04/16 2056 137/79 98.9 F (37.2 C) Oral 93 18 96 % - -  03/04/16 1852 127/70 99.7 F (37.6 C) Oral 95 16 98 % - -  03/04/16 1851 - - - - - - 5\' 10"  (1.778 m) 181 lb (82.1 kg)  03/04/16 1703 151/88 (!) 103.2 F (39.6 C) - 100 18 100 % - -  03/04/16 1545 144/97 - - 80 - 100 % - 180 lb (81.6 kg)  03/04/16 1515 (!) 155/105 - - 89 - 100 % - -    Well-developed and well-nourished [place] male in no obvious distress. Head and neck exam shows no scleral icterus. He has no ocular or oral lesions. He has no adenopathy in the neck. Thyroid is not palpable. Lungs are clear to percussion and auscultation bilaterally. Cardiac exam regular rate and rhythm with no murmurs, rubs or bruits. Abdomen is soft. He has good bowel sounds. There is no guarding or rebound tenderness. He's had no fluid wave. There is no palpable liver or spleen tip. Back exam shows no tenderness over the spine, ribs or hips. Externally shows no clubbing, cyanosis or edema. Skin exam shows no rashes, ecchymoses or petechia. Neurological exam shows no focal neurological deficits.    Recent Labs  03/04/16 1227 03/05/16 0529  WBC 3.1* 2.8*  HGB 11.9* 10.2*  HCT 33.5* 29.7*  PLT 44* 52*    Recent Labs  03/04/16 1227 03/05/16 0529  NA 132* 134*  K 3.9 3.9  CL 102 105  CO2 21* 24  GLUCOSE 129* 94  BUN 13 <5*  CREATININE 1.11 1.07  CALCIUM 8.9 8.3*    Blood smear review:  Mild anisocytosis. He has some microcytic red blood cells. I see no inclusion bodies. He has no target cells. He has no schistocytes or spherocytes. There are no nucleated red blood cells. He has no rouleau  formation. White cells are decreased in number. He is mature white blood cells. I see no immature myeloid forms. He has no hypersegmented polys. He has no atypical lymphocytes. Platelets are decreased in number. He has several large platelets that are well granulated.  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 He has chronic  thrombocytopenia. Suspect that he probably has chronic immune thrombocytopenia.  I think that what we are looking at probably some kind of infectious process. I don't think that we are looking at anything malignant.  Given that he lived in [place], he may have been exposed to endemic fungi in [place].  I don't think that we need to do a bone marrow test on him. I just don't think that there is anything that the bone marrow would show us that would alter management right now.  I would be willing just to watch right now. If he has an infection, his white cell count could certainly go down before comes back up.  I'm not sure how to explain the elevated bilirubin. Again, I have to think about some kind of infectious process. I will know if there is a Viral syndrome that he may have. I'm sure that infectious disease has been counseled to try to help out. Endocarditis is was a possibility but I don't hear any type of murmur.  Given his profession, he is at higher risk for infectious disease. However, I'm sure that he is very diligent with taking precautions in the workplace.  He may have a hemoglobinopathy. Given that he is from [place], he probably has an abnormal hemoglobin that is causing the anemia and low MCV. However, iron studies by need to be checked.  We will follow along. This is certainly a most interesting case. Dr. Lyndal RainbowHameed is very nice and obviously very knowledgeable.  I appreciate the opportunity to have seen him.  Christin BachPete Ennever, MD  Fayrene FearingJames 1:5-7

## 2016-03-05 NOTE — Significant Event (Signed)
Paged by RN concerning patient experiencing abdominal discomfort. Says he "feels like he has gas build up." Patient was ambulated and given simethicone. Passed bowel movement with normal formed stool with some relief. Patient was resting comfortably in his bed with normoactive bowel sounds without abdominal tenderness. Discussed lab results and plan with family in room prior to leaving. -- Durward Parcelavid McMullen, DO [facility], PGY-1

## 2016-03-05 NOTE — Progress Notes (Signed)
Abigail Buttsalled Paula, [place] RN to put patient on the radar.  Explained to her what is going on with patient. Continue to monitor.

## 2016-03-05 NOTE — Consult Note (Signed)
WOC Nurse wound consult note Reason for Consult: Full thickness lesion on right side of gluteal cleft. Gluteal abscess drained approximately [facility] week ago [facility].  Wound type: Infectious Pressure Ulcer POA: No Measurement: 1cm x 0.4cm x 2.0cm Wound bed: Clean, red, moist Drainage (amount, consistency, odor) Scant serous on old dressing Periwound:Intact Dressing procedure/placement/frequency: I will provide Nursing with guidance via the Orders for cleansing and filling of the defect twice daily with 1/4 inch iodoform gauze packing strip, topped with dry gauze and covered with a silicone dressing (for ease in removal due to patient's dense hair growth in the area of treatment). WOC nursing team will not follow, but will remain available to this patient, the nursing and medical teams.  Please re-consult if needed. Thanks, Ladona MowLaurie Talor Cheema, MSN, RN, GNP, Hans EdenCWOCN, CWON-AP, FAAN  Pager# [phone]

## 2016-03-06 ENCOUNTER — Inpatient Hospital Stay (HOSPITAL_COMMUNITY): Payer: BLUE CROSS/BLUE SHIELD

## 2016-03-06 DIAGNOSIS — Z872 Personal history of diseases of the skin and subcutaneous tissue: Secondary | ICD-10-CM

## 2016-03-06 DIAGNOSIS — Z09 Encounter for follow-up examination after completed treatment for conditions other than malignant neoplasm: Secondary | ICD-10-CM

## 2016-03-06 DIAGNOSIS — Z91040 Latex allergy status: Secondary | ICD-10-CM

## 2016-03-06 DIAGNOSIS — R161 Splenomegaly, not elsewhere classified: Secondary | ICD-10-CM

## 2016-03-06 DIAGNOSIS — Z862 Personal history of diseases of the blood and blood-forming organs and certain disorders involving the immune mechanism: Secondary | ICD-10-CM

## 2016-03-06 DIAGNOSIS — Z9889 Other specified postprocedural states: Secondary | ICD-10-CM

## 2016-03-06 DIAGNOSIS — D696 Thrombocytopenia, unspecified: Secondary | ICD-10-CM

## 2016-03-06 DIAGNOSIS — Z833 Family history of diabetes mellitus: Secondary | ICD-10-CM

## 2016-03-06 DIAGNOSIS — Z880 Allergy status to penicillin: Secondary | ICD-10-CM

## 2016-03-06 LAB — COMPREHENSIVE METABOLIC PANEL
ALBUMIN: 2.3 g/dL — AB (ref 3.5–5.0)
ALT: 49 U/L (ref 17–63)
ANION GAP: 7 (ref 5–15)
AST: 34 U/L (ref 15–41)
Alkaline Phosphatase: 178 U/L — ABNORMAL HIGH (ref 38–126)
BILIRUBIN TOTAL: 2.4 mg/dL — AB (ref 0.3–1.2)
BUN: 7 mg/dL (ref 6–20)
CHLORIDE: 102 mmol/L (ref 101–111)
CO2: 24 mmol/L (ref 22–32)
Calcium: 8.5 mg/dL — ABNORMAL LOW (ref 8.9–10.3)
Creatinine, Ser: 0.95 mg/dL (ref 0.61–1.24)
GFR calc Af Amer: >60 mL/min (ref >60)
GFR calc non Af Amer: >60 mL/min (ref >60)
GLUCOSE: 114 mg/dL — AB (ref 65–99)
POTASSIUM: 3.4 mmol/L — AB (ref 3.5–5.1)
SODIUM: 133 mmol/L — AB (ref 135–145)
TOTAL PROTEIN: 5.7 g/dL — AB (ref 6.5–8.1)

## 2016-03-06 LAB — DIFFERENTIAL
Basophils Absolute: 0 10*3/uL (ref 0.0–0.1)
Basophils Relative: 1 %
EOS ABS: 0.1 10*3/uL (ref 0.0–0.7)
EOS PCT: 3 %
LYMPHS ABS: 0.8 10*3/uL (ref 0.7–4.0)
Lymphocytes Relative: 22 %
MONOS PCT: 17 %
Monocytes Absolute: 0.6 10*3/uL (ref 0.1–1.0)
Neutro Abs: 2 10*3/uL (ref 1.7–7.7)
Neutrophils Relative %: 57 %
WBC MORPHOLOGY: INCREASED

## 2016-03-06 LAB — CBC
HEMATOCRIT: 30.1 % — AB (ref 39.0–52.0)
Hemoglobin: 10.4 g/dL — ABNORMAL LOW (ref 13.0–17.0)
MCH: 27.2 pg (ref 26.0–34.0)
MCHC: 34.6 g/dL (ref 30.0–36.0)
MCV: 78.8 fL (ref 78.0–100.0)
PLATELETS: 61 10*3/uL — AB (ref 150–400)
RBC: 3.82 MIL/uL — ABNORMAL LOW (ref 4.22–5.81)
RDW: 14.9 % (ref 11.5–15.5)
WBC: 3.6 10*3/uL — AB (ref 4.0–10.5)

## 2016-03-06 MED ORDER — POTASSIUM CHLORIDE CRYS ER 20 MEQ PO TBCR
40.0000 meq | EXTENDED_RELEASE_TABLET | Freq: Once | ORAL | Status: AC
  Administered 2016-03-06: 40 meq via ORAL
  Filled 2016-03-06: qty 2

## 2016-03-06 NOTE — Progress Notes (Signed)
Echo done.

## 2016-03-06 NOTE — Progress Notes (Signed)
CM spoke with pt to confirm he does NOT have a PCP.  CM encouraged pt to call the 888 number on the back of his BC[employer] card to get a PCP within his network.  CM has also placed [facility] resource number on pt's AVS for available PCPs.  No other CM needs were communicated.

## 2016-03-06 NOTE — Consult Note (Addendum)
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 He states that he tolerated the procedure initially but then had significant bleeding post procedure requiring to be repacked. He would return every other day to the clinic to get repacked. The following day after having I x d, he started to have high fevers of 103, chills, nightsweats which he would treat with ibuprofen and alternative with dayquil. He reports still feeling poorly since onset of his abscess, with chills, fevers, malaise, as well as itching of skin. On 11/1 he went back to [facility] for eval where he had CBC drawn with showed wbc of 3.5, hgb 12.4, plt of 99. Due to worsening symptoms, he went to the ED on 11/3 for evaluation. He was found to have multiple lab abn with leukopenia, WBC 3.1, anemia of 11.9  and marked thrombocytopenia of 44.  His chemistry revealed elevated ALP of 189, and hyperbilirubinemia of 3.9. His fevers on admit were as high as 103F but hemodynamically stable. He was admitted for concern of cholangitis/sepsis.  Due to elevated alk phos and bilirubinemia, he underwent abd CT that showed evidence of splenomegaly measuring 16cm, which he is not known to have. He has numerous small ill defined low attenuation lesions seen throughout the spleen. [place] of liver did not show any ductal dilatation or biliary obstruction.  [place]: He has remote hx of being dx with idiopathic thrombocytopenia in  2005 where he had extensive work up. He has skin abscesses roughly 2 times per year that usually drawn on their own  All: No prior hx of taking bactrim. Had rash with penicillin as a child but tolerates cephalosporins  No recent injury or needle sticks at work  Family hx = no hx of familial blood dyscrasia. But has diabetes with his brother  Allergies:  Allergies  Allergen Reactions  . Penicillins   . Latex Rash    MEDICATIONS: . Influenza vac split quadrivalent PF  0.5 mL Intramuscular Tomorrow-1000  . piperacillin-tazobactam (ZOSYN)  IV  3.375 g Intravenous Q8H  . vancomycin  1,250 mg Intravenous Q12H    Social History  Substance Use Topics  . Smoking status: Never Smoker  . Smokeless tobacco: Never Used  . Alcohol use No     Review of Systems -  Constitutional: + for fever, chills, diaphoresis, activity change, appetite change, fatigue and unexpected weight change.  HENT: Negative for congestion, sore throat, rhinorrhea, sneezing, trouble swallowing and sinus pressure.  Eyes: Negative for photophobia and visual disturbance.  Respiratory: Negative for cough, chest tightness, shortness of breath, wheezing and stridor.  Cardiovascular: Negative for chest pain, palpitations and leg swelling.  Gastrointestinal: Negative for nausea, vomiting, abdominal pain, diarrhea, constipation, blood in stool, abdominal distention and anal bleeding.  Genitourinary: Negative for dysuria, hematuria, flank pain and difficulty urinating.  Musculoskeletal:  Negative for myalgias, back pain, joint swelling, arthralgias and gait problem.  Skin: Negative for color change, pallor, rash and wound.  Neurological: Negative for dizziness, tremors, weakness and light-headedness.  Hematological: Negative for adenopathy. Does not bruise/bleed easily.  Psychiatric/Behavioral: Negative for behavioral problems, confusion, sleep disturbance, dysphoric mood, decreased concentration and agitation.      OBJECTIVE: Temp:  [98 F (36.7 C)-99.8 F (37.7 C)] 98.8 F (37.1 C) (11/05 0336) Pulse Rate:  [77-88] 77 (11/05 0336) Resp:  [16-20] 19 (11/05 0336) BP: (115-134)/(71-79) 119/78 (11/05 0336) SpO2:  [95 %-99 %] 96 % (11/05 0336) Physical Exam  Constitutional: He is oriented to person, place, and time. He appears well-developed and well-nourished. No distress.  HENT:  Mouth/Throat: Oropharynx is clear and moist. No oropharyngeal exudate.  Cardiovascular: Normal rate, regular rhythm and normal heart sounds. Exam reveals no gallop and no friction rub.  No murmur heard.  Pulmonary/Chest: Effort normal and breath sounds normal. No respiratory distress. He has no wheezes.  Abdominal: Soft. Bowel sounds are normal. He exhibits no distension. There is no tenderness.  Buttock= small wick sticking out from incision, no induration or erythema. Serous drainage on bandage Lymphadenopathy:  He has no cervical adenopathy.  Neurological: He is alert and oriented to person, place, and time.  Skin: Skin is warm and dry. No rash noted. No erythema.  Psychiatric: He has a normal mood and affect. His behavior is normal.    LABS: Results for orders placed or performed during the hospital encounter of 03/04/16 (from the past 48 hour(s))  Blood culture (routine x 2)     Status: None (Preliminary result)   Collection Time: 03/04/16 12:21 PM  Result Value Ref Range   Specimen Description BLOOD RIGHT ANTECUBITAL    Special Requests BOTTLES DRAWN AEROBIC AND ANAEROBIC 5ML    Culture NO GROWTH 2 DAYS    Report Status PENDING   CBC with Differential/Platelet     Status: Abnormal   Collection Time: 03/04/16 12:27 PM  Result Value Ref Range   WBC 3.1 (L) 4.0 - 10.5 K/uL   RBC 4.23 4.22 - 5.81 MIL/uL   Hemoglobin 11.9 (L) 13.0 - 17.0 g/dL   HCT 33.5 (L) 39.0 - 52.0 %   MCV 79.2 78.0 - 100.0 fL   MCH 28.1 26.0 - 34.0 pg   MCHC 35.5 30.0 - 36.0 g/dL   RDW 15.0 11.5 - 15.5 %   Platelets 44 (L) 150 -  400 K/uL    Comment: REPEATED TO VERIFY SPECIMEN CHECKED FOR CLOTS PLATELET COUNT CONFIRMED BY SMEAR    Neutrophils Relative % 75 %   Neutro Abs 2.3 1.7 - 7.7 K/uL   Lymphocytes Relative 11 %   Lymphs Abs 0.4 (L) 0.7 - 4.0 K/uL   Monocytes Relative 13 %   Monocytes Absolute 0.4 0.1 - 1.0 K/uL   Eosinophils Relative 1 %   Eosinophils Absolute 0.0 0.0 - 0.7 K/uL   Basophils Relative 0 %   Basophils Absolute 0.0 0.0 - 0.1 K/uL  Comprehensive metabolic panel     Status: Abnormal   Collection Time: 03/04/16 12:27 PM  Result Value Ref Range   Sodium 132 (L) 135 - 145 mmol/L   Potassium 3.9 3.5 - 5.1 mmol/L   Chloride 102 101 - 111 mmol/L   CO2 21 (L) 22 - 32 mmol/L   Glucose, Bld 129 (H) 65 - 99 mg/dL   BUN 13 6 - 20 mg/dL   Creatinine, Ser 1.11 0.61 - 1.24  mg/dL   Calcium 8.9 8.9 - 10.3 mg/dL   Total Protein 6.1 (L) 6.5 - 8.1 g/dL   Albumin 2.8 (L) 3.5 - 5.0 g/dL   AST 36 15 - 41 U/L   ALT 56 17 - 63 U/L   Alkaline Phosphatase 216 (H) 38 - 126 U/L   Total Bilirubin 4.8 (H) 0.3 - 1.2 mg/dL   GFR calc non Af Amer >60 >60 mL/min   GFR calc Af Amer >60 >60 mL/min    Comment: (NOTE) The eGFR has been calculated using the CKD EPI equation. This calculation has not been validated in all clinical situations. eGFR's persistently <60 mL/min signify possible Chronic Kidney Disease.    Anion gap 9 5 - 15  Lipase, blood     Status: None   Collection Time: 03/04/16 12:27 PM  Result Value Ref Range   Lipase 43 11 - 51 U/L  Acetaminophen level     Status: Abnormal   Collection Time: 03/04/16 12:27 PM  Result Value Ref Range   Acetaminophen (Tylenol), Serum <10 (L) 10 - 30 ug/mL    Comment:        THERAPEUTIC CONCENTRATIONS VARY SIGNIFICANTLY. A RANGE OF 10-30 ug/mL MAY BE AN EFFECTIVE CONCENTRATION FOR MANY PATIENTS. HOWEVER, SOME ARE BEST TREATED AT CONCENTRATIONS OUTSIDE THIS RANGE. ACETAMINOPHEN CONCENTRATIONS >150 ug/mL AT 4 HOURS AFTER INGESTION AND >50 ug/mL AT 12 HOURS  AFTER INGESTION ARE OFTEN ASSOCIATED WITH TOXIC REACTIONS.   Urinalysis, Routine w reflex microscopic (not at [facility])     Status: Abnormal   Collection Time: 03/04/16 12:37 PM  Result Value Ref Range   Color, Urine AMBER (A) YELLOW    Comment: BIOCHEMICALS MAY BE AFFECTED BY COLOR   APPearance CLEAR CLEAR   Specific Gravity, Urine 1.022 1.005 - 1.030   pH 6.0 5.0 - 8.0   Glucose, UA NEGATIVE NEGATIVE mg/dL   Hgb urine dipstick LARGE (A) NEGATIVE   Bilirubin Urine LARGE (A) NEGATIVE   Ketones, ur NEGATIVE NEGATIVE mg/dL   Protein, ur 30 (A) NEGATIVE mg/dL   Nitrite NEGATIVE NEGATIVE   Leukocytes, UA SMALL (A) NEGATIVE  Urine microscopic-add on     Status: Abnormal   Collection Time: 03/04/16 12:37 PM  Result Value Ref Range   Squamous Epithelial / LPF 0-5 (A) NONE SEEN   WBC, UA 0-5 0 - 5 WBC/hpf   RBC / HPF 6-30 0 - 5 RBC/hpf   Bacteria, UA FEW (A) NONE SEEN   Casts GRANULAR CAST (A) NEGATIVE   Crystals CA OXALATE CRYSTALS (A) NEGATIVE   Urine-Other MUCOUS PRESENT   I-Stat CG4 Lactic Acid, ED     Status: None   Collection Time: 03/04/16 12:41 PM  Result Value Ref Range   Lactic Acid, Venous 1.53 0.5 - 1.9 mmol/L  Blood culture (routine x 2)     Status: None (Preliminary result)   Collection Time: 03/04/16  1:03 PM  Result Value Ref Range   Specimen Description BLOOD RIGHT HAND    Special Requests BOTTLES DRAWN AEROBIC AND ANAEROBIC 5CC    Culture NO GROWTH 2 DAYS    Report Status PENDING   Hepatitis panel, acute     Status: None   Collection Time: 03/04/16  1:25 PM  Result Value Ref Range   Hepatitis B Surface Ag Negative Negative   HCV Ab <0.1 0.0 - 0.9 s/co ratio    Comment: (NOTE)  Negative:     < 0.8                             Indeterminate: 0.8 - 0.9                                  Positive:     > 0.9 The CDC recommends that a positive HCV antibody result be followed up with a HCV Nucleic Acid Amplification test  (989211). Performed At: [facility] [place], [place] [id number] Lindon Romp MD GY:[id number]    Hep A IgM Negative Negative   Hep B C IgM Negative Negative  Bilirubin, fractionated(tot/dir/indir)     Status: Abnormal   Collection Time: 03/04/16  2:06 PM  Result Value Ref Range   Total Bilirubin 4.3 (H) 0.3 - 1.2 mg/dL   Bilirubin, Direct 2.9 (H) 0.1 - 0.5 mg/dL   Indirect Bilirubin 1.4 (H) 0.3 - 0.9 mg/dL  Protime-INR     Status: None   Collection Time: 03/04/16  2:06 PM  Result Value Ref Range   Prothrombin Time 12.9 11.4 - 15.2 seconds   INR 0.97   I-Stat CG4 Lactic Acid, ED     Status: None   Collection Time: 03/04/16  3:42 PM  Result Value Ref Range   Lactic Acid, Venous 1.81 0.5 - 1.9 mmol/L  HIV antibody     Status: None   Collection Time: 03/04/16  5:13 PM  Result Value Ref Range   HIV Screen 4th Generation wRfx Non Reactive Non Reactive    Comment: (NOTE) Performed At: [facility] [place], [place] [id number] Lindon Romp MD OY:[id number]   TSH     Status: None   Collection Time: 03/04/16  5:13 PM  Result Value Ref Range   TSH 1.115 0.350 - 4.500 uIU/mL    Comment: Performed by a 3rd Generation assay with a functional sensitivity of <=0.01 uIU/mL.  Lactic acid, plasma     Status: None   Collection Time: 03/04/16  7:17 PM  Result Value Ref Range   Lactic Acid, Venous 1.7 0.5 - 1.9 mmol/L  Lactic acid, plasma     Status: None   Collection Time: 03/04/16  9:50 PM  Result Value Ref Range   Lactic Acid, Venous 1.3 0.5 - 1.9 mmol/L  Comprehensive metabolic panel     Status: Abnormal   Collection Time: 03/05/16  5:29 AM  Result Value Ref Range   Sodium 134 (L) 135 - 145 mmol/L   Potassium 3.9 3.5 - 5.1 mmol/L   Chloride 105 101 - 111 mmol/L   CO2 24 22 - 32 mmol/L   Glucose, Bld 94 65 - 99 mg/dL   BUN <5 (L) 6 - 20 mg/dL   Creatinine, Ser 1.07 0.61 - 1.24 mg/dL   Calcium 8.3 (L) 8.9 - 10.3 mg/dL   Total  Protein 5.4 (L) 6.5 - 8.1 g/dL   Albumin 2.2 (L) 3.5 - 5.0 g/dL   AST 34 15 - 41 U/L   ALT 49 17 - 63 U/L   Alkaline Phosphatase 189 (H) 38 - 126 U/L   Total Bilirubin 3.9 (H) 0.3 - 1.2 mg/dL   GFR calc non Af Amer >60 >60 mL/min   GFR calc Af Amer >60 >60 mL/min    Comment: (NOTE) The eGFR has been calculated using the  CKD EPI equation. This calculation has not been validated in all clinical situations. eGFR's persistently <60 mL/min signify possible Chronic Kidney Disease.    Anion gap 5 5 - 15  CBC     Status: Abnormal   Collection Time: 03/05/16  5:29 AM  Result Value Ref Range   WBC 2.8 (L) 4.0 - 10.5 K/uL   RBC 3.76 (L) 4.22 - 5.81 MIL/uL   Hemoglobin 10.2 (L) 13.0 - 17.0 g/dL   HCT 29.7 (L) 39.0 - 52.0 %   MCV 79.0 78.0 - 100.0 fL   MCH 27.1 26.0 - 34.0 pg   MCHC 34.3 30.0 - 36.0 g/dL   RDW 15.1 11.5 - 15.5 %   Platelets 52 (L) 150 - 400 K/uL    Comment: CONSISTENT WITH PREVIOUS RESULT  Ferritin     Status: None   Collection Time: 03/05/16  3:28 PM  Result Value Ref Range   Ferritin 97 24 - 336 ng/mL  Iron and TIBC     Status: Abnormal   Collection Time: 03/05/16  3:28 PM  Result Value Ref Range   Iron 35 (L) 45 - 182 ug/dL   TIBC 267 250 - 450 ug/dL   Saturation Ratios 13 (L) 17.9 - 39.5 %   UIBC 232 ug/dL  Reticulocytes     Status: Abnormal   Collection Time: 03/05/16  3:28 PM  Result Value Ref Range   Retic Ct Pct 1.4 0.4 - 3.1 %   RBC. 3.73 (L) 4.22 - 5.81 MIL/uL   Retic Count, Manual 52.2 19.0 - 186.0 K/uL  CBC     Status: Abnormal   Collection Time: 03/06/16  5:30 AM  Result Value Ref Range   WBC 3.6 (L) 4.0 - 10.5 K/uL   RBC 3.82 (L) 4.22 - 5.81 MIL/uL   Hemoglobin 10.4 (L) 13.0 - 17.0 g/dL   HCT 30.1 (L) 39.0 - 52.0 %   MCV 78.8 78.0 - 100.0 fL   MCH 27.2 26.0 - 34.0 pg   MCHC 34.6 30.0 - 36.0 g/dL   RDW 14.9 11.5 - 15.5 %   Platelets 61 (L) 150 - 400 K/uL    Comment: CONSISTENT WITH PREVIOUS RESULT  Comprehensive metabolic panel     Status:  Abnormal   Collection Time: 03/06/16  5:30 AM  Result Value Ref Range   Sodium 133 (L) 135 - 145 mmol/L   Potassium 3.4 (L) 3.5 - 5.1 mmol/L   Chloride 102 101 - 111 mmol/L   CO2 24 22 - 32 mmol/L   Glucose, Bld 114 (H) 65 - 99 mg/dL   BUN 7 6 - 20 mg/dL   Creatinine, Ser 0.95 0.61 - 1.24 mg/dL   Calcium 8.5 (L) 8.9 - 10.3 mg/dL   Total Protein 5.7 (L) 6.5 - 8.1 g/dL   Albumin 2.3 (L) 3.5 - 5.0 g/dL   AST 34 15 - 41 U/L   ALT 49 17 - 63 U/L   Alkaline Phosphatase 178 (H) 38 - 126 U/L   Total Bilirubin 2.4 (H) 0.3 - 1.2 mg/dL   GFR calc non Af Amer >60 >60 mL/min   GFR calc Af Amer >60 >60 mL/min    Comment: (NOTE) The eGFR has been calculated using the CKD EPI equation. This calculation has not been validated in all clinical situations. eGFR's persistently <60 mL/min signify possible Chronic Kidney Disease.    Anion gap 7 5 - 15    MICRO: 11/3 blood cx ngtd @ 24hr. IMAGING: US  Abdomen Complete  Result Date: 03/04/2016 CLINICAL DATA:  Elevated bilirubin. EXAM: ABDOMEN ULTRASOUND COMPLETE COMPARISON:  None. FINDINGS: Gallbladder: Gallbladder is contracted. The patient reports not being NPO with the study. Sludge is noted within the gallbladder, and the wall is difficult to visualize. Common bile duct: Diameter: Normal caliber, 6 mm. The distal duct is obscured by overlying bowel gas. Liver: Mildly increased echotexture diffusely suggesting fatty infiltration. No biliary ductal dilatation or focal abnormality. IVC: No abnormality visualized. Pancreas: Visualized portion unremarkable. Spleen: Upper limits normal in craniocaudal length of 12.4 cm. Heterogeneous echotexture. There is concern for numerous hyperechoic small nodules throughout the spleen. Right Kidney: Length: 11.3 cm. Echogenicity within normal limits. No mass or hydronephrosis visualized. Left Kidney: Length: 12.3 cm. Echogenicity within normal limits. No mass or hydronephrosis visualized. Abdominal aorta: No aneurysm  visualized. Other findings: Prominent lymph nodes are noted in the porta hepatis region, measuring up to 2.7 cm. IMPRESSION: Mildly increased echotexture throughout the liver suggesting fatty infiltration. No biliary duct dilatation. Gallbladder is contracted.  Sludge is noted within the gallbladder. Heterogeneous echotexture throughout a borderline size spleen with apparent scattered numerous small hyperechoic lesions throughout the spleen. This could be further evaluated with contrast-enhanced CT to confirm and further evaluate. Electronically Signed   By: Rolm Baptise M.D.   On: 03/04/2016 16:53   Ct Abdomen Pelvis W Contrast  Result Date: 03/04/2016 CLINICAL DATA:  Abdominal bloating and discomfort for 5 days. Fever. EXAM: CT ABDOMEN AND PELVIS WITH CONTRAST TECHNIQUE: Multidetector CT imaging of the abdomen and pelvis was performed using the standard protocol following bolus administration of intravenous contrast. CONTRAST:  161m ISOVUE-300 IOPAMIDOL (ISOVUE-300) INJECTION 61% COMPARISON:  None. FINDINGS: Lower Chest: No acute findings. Hepatobiliary: No mass identified. Gallbladder is unremarkable. No evidence of biliary dilatation. Pancreas:  No mass or inflammatory changes. Spleen: Mild to moderate splenomegaly, with spleen measuring 16 cm in length. Numerous small ill-defined low-attenuation lesions are seen throughout the splenic parenchyma. Adrenals/Urinary Tract: No masses identified. No evidence of hydronephrosis. Several tiny 2-3 mm nonobstructive right renal calculi are identified. No evidence of ureteral calculi or dilatation. Unopacified urinary bladder is unremarkable in appearance. Stomach/Bowel: No evidence of obstruction, inflammatory process or abnormal fluid collections. Normal appendix visualized. Vascular/Lymphatic: No pathologically enlarged lymph nodes. No abdominal aortic aneurysm. Aortic atherosclerosis. Reproductive:  No mass identified. Other:  None. Musculoskeletal:  No  suspicious bone lesions identified. IMPRESSION: Splenomegaly with diffuse involvement by small ill-defined low-attenuation lesions. Differential diagnosis includes splenic abscesses/infectious etiologies, lymphoproliferative disorder, sarcoidosis, and metastatic disease. No other soft tissue masses or lymphadenopathy within the abdomen or pelvis. Recommend correlation for clinical or laboratory signs of infectious etiologies and lymphoproliferative disorder. Nonobstructive right nephrolithiasis. No evidence of ureteral calculi or hydronephrosis. Electronically Signed   By: JEarle GellM.D.   On: 03/04/2016 17:57   Dg Chest Port 1 View  Result Date: 03/04/2016 CLINICAL DATA:  Fever. Sepsis. Thrombocytopenia. Hyperbilirubinemia. EXAM: PORTABLE CHEST 1 VIEW COMPARISON:  05/04/2013 FINDINGS: The heart size and mediastinal contours are within normal limits. Both lungs are clear. The visualized skeletal structures are unremarkable. IMPRESSION: No active disease. Electronically Signed   By: JEarle GellM.D.   On: 03/04/2016 19:30     Assessment/Plan:  [id number] with history of idiopathic thrombocytopenia who underwent IXD of buttock abscess on 10/26 and given 10 day course of bactrim to cover presumed MRSA infection. He presented on 11/3 now with leukopenia, thrombocytopenia and hyperbilirubinemia, predominantly conjugated with fevers.   Bactrim is known to  have drug-induced bone marrow suppression with fevers. In part this process is due to antibody mediated destruction of platelets with 2a/3b glycoprotein specificity. Trimethoprim can also inhibit folate metabolism that contributes to some of these findings.  Lastly there are reports of cholestatic jaundice associated with bactrim  Based upon his labs, I suspect that some of these findings are due to bactrim and anticipate that they will improve once drug is clearing out of his system  Thrombocytopenia= appears improving, still below baseline of  100  Fevers = His high fevers could represent drug fever but this could also be due to fever from having IXD with translocation of bacteria into his bloodstream  Since he is hemodynamically stable, I recommend to stop vancomycin and piptazo for now.   I would repeat his blood cx if he has another fever (off of abtx)  Also, if blood cx become positive, then would add abtx that is appropriate (can do vancomycin empirically)  Splenomegaly = CT findings of splenomegaly is not unusual in patients with thrombocytopenia, though small attenuated lesions are concerning for possible septic emboli/endocarditis  Agree with plan to get TTE, and possibly TEE if needed  Buttock abscess= has received 7 d course of abtx, appears healing. No need to continue abtx for this at this time.  Dr. Linus Salmons to provide further recs tomorrow

## 2016-03-06 NOTE — Progress Notes (Signed)
Pt concerned about oral temperature- vitals have been taken. Temp is normal at this time. RN told pt that it will be taken again early in the morning to make sure that he does not have a temp.

## 2016-03-06 NOTE — Progress Notes (Signed)
Family Medicine Teaching Service Daily Progress Note Intern Pager: [phone]  Patient name: Joshua Wagner Medical record number: [id number] Date of birth: 10/23/1970 Age: 45 y.o. Gender: male  Primary Care Provider: No PCP Per Patient Consultants: Oncology, ID  Code Status: FULL  Pt Overview and Major Events to Date:  11/03: Admit for FUO and direct hyperbilirubinemia, CT and U/S showed splenic lesion concerning for abscesses  Assessment and Plan: Kelly Eisler is a 45 y.o. male presenting with fever and jaundice . PMH is significant for thrombocytopenia (has been to an oncologist and had a bone marrow biopsy done 12-13 years ago, but none of the tests came back positive).   #Sepsis, Unknown Origin, Likely Spleen Given Imaging: Abd/pelvic CT and U/S is concerning for splenic abscesses with uncertain source. Afebrile overnight.  - will consult ID today  --Initial BCx NGTD, will obtain BCx again after 24 hrs given fever overnight, on vanc/zosyn --Vancomycin IV and Zosyn IV (day 3) --TTE pending, if neg, may need Cards consult for consideration of TEE --Trend CBC and CMET --Vitals qshift --Tylenol 650 mg q6h and Ibuprofen 400 mg q6h PRN mild-moderate pain  #Direct Hyperbilirubinemia, Acute, Unknown Etiology: Bili levels tot 4.3, direct 2.9, indirect 1.4 upon admission. Alk phos elevated 216 without transaminitis. Lipase wnl.  Possible differentials include primary biliary cholangitis, primary sclerosing cholangitis, viral hepatitis, Dubin-Johnson. CT and U/S concerning for splenic abscesses, see note above. No biliary ductal dilation or liver pathology noted. Hep panel and HIV neg. Liver enzymes wnl, alk phos trending down 216>189 >178 and direct bili trending down 4.3>3.9 >2.4 --F/u bili levels --Work up for source of splenic abscesses  #Abscess of Gluteal Crease, Healing, Stable: Has history of previous gluteal abscess many years ago which spontaneously resolved. The current abscess  was drained 1 week ago at [facility] clinic with continual repacking and Bactrim. No known h/o Crohns disease. No drainage or signs of infection at site with clean, healing incision. No surrounding erythema, warmth, or fluctuance.  --Trend CBC --Wound care consulted  #Pancytopenia WBC 3.1, Hgb 11.9, Plt 44 during admission with decrease in absolute lymphs. H/o thrombocytopenia, see above. Uncertain chronicity of this given patient does not see a physician regularly and has not seen hematology in 12 years. Plt increasing 44>52 >61, WBC and Hgb still improving as well. Blood smear with anisocytosis with some microcytic RBC w/ decreased WBC and plt.  -- follow CBC and CMET --oncology consulted, appreciate recc: suspect chronic immune thrombocytopenia. Suspect infectious process for other symptoms, less suspicious for malignancy. Patient may have hemoglobinopathy-consider checking iron studies   #Hypokalemia: mild  - replete Kdur 34mq x 1  FEN/GI: Cardiac diet, saline lock Prophylaxis: SCDs  Disposition: Pending improvement of FUO, direct hyperbilirubinemia, and source of splenic abscesses.  Subjective:  No events overnight. Afebrile for at least 24 hours. Vitals stable. Patient doing well. Reports abdominal pain has improved; simethicone helped. Normal BMs. No dysuria.   Objective: Temp:  [98 F (36.7 C)-99.8 F (37.7 C)] 98.8 F (37.1 C) (11/05 0336) Pulse Rate:  [77-88] 77 (11/05 0336) Resp:  [16-20] 19 (11/05 0336) BP: (115-134)/(71-79) 119/78 (11/05 0336) SpO2:  [95 %-99 %] 96 % (11/05 0336) Physical Exam: General: well nourished, well developed, in no acute distress with non-toxic appearance CV: regular rate and rhythm, no murmur, rubs, or gallops Lungs: clear to auscultation bilaterally with normal work of breathing Abdomen: soft, mild tenderness in the lower abdomen without guarding or rebound, no masses or organomegaly palpable, normoactive bowel sounds  Skin: warm, dry,  gluteal crease- noted packing material, no surrounding erythema or drainage.  Extremities: warm and well perfused, normal tone  Laboratory:  Recent Labs Lab 03/04/16 1227 03/05/16 0529  WBC 3.1* 2.8*  HGB 11.9* 10.2*  HCT 33.5* 29.7*  PLT 44* 52*    Recent Labs Lab 03/04/16 1227 03/04/16 1406 03/05/16 0529  NA 132*  --  134*  K 3.9  --  3.9  CL 102  --  105  CO2 21*  --  24  BUN 13  --  <5*  CREATININE 1.11  --  1.07  CALCIUM 8.9  --  8.3*  PROT 6.1*  --  5.4*  BILITOT 4.8* 4.3* 3.9*  ALKPHOS 216*  --  189*  ALT 56  --  49  AST 36  --  34  GLUCOSE 129*  --  94   UA: amber, large Hgb and bili, 30 protein, small leuks Urine micro: Ca ox crystals with granular casts and few bacteria Lipase: 43 Acetaminophen: wnl Lactic acid: 1.53 BCx: NGTD Hepatitis: neg PT/INR: 12.9/0.97 HIV: neg TSH: wnl  Imaging/Diagnostic Tests: CT ABDOMEN PELVIS W CONTRAST (03/04/16) FINDINGS: Lower Chest: No acute findings.  Hepatobiliary: No mass identified. Gallbladder is unremarkable. No evidence of biliary dilatation.  Pancreas:  No mass or inflammatory changes.  Spleen: Mild to moderate splenomegaly, with spleen measuring 16 cm in length. Numerous small ill-defined low-attenuation lesions are seen throughout the splenic parenchyma.  Adrenals/Urinary Tract: No masses identified. No evidence of hydronephrosis. Several tiny 2-3 mm nonobstructive right renal calculi are identified. No evidence of ureteral calculi or dilatation. Unopacified urinary bladder is unremarkable in appearance.  Stomach/Bowel: No evidence of obstruction, inflammatory process or abnormal fluid collections. Normal appendix visualized.  Vascular/Lymphatic: No pathologically enlarged lymph nodes. No abdominal aortic aneurysm. Aortic atherosclerosis.  Reproductive:  No mass identified.  Other:  None.  Musculoskeletal:  No suspicious bone lesions identified.  IMPRESSION: Splenomegaly with  diffuse involvement by small ill-defined low-attenuation lesions. Differential diagnosis includes splenic abscesses/infectious etiologies, lymphoproliferative disorder, sarcoidosis, and metastatic disease. No other soft tissue masses or lymphadenopathy within the abdomen or pelvis. Recommend correlation for clinical or laboratory signs of infectious etiologies and lymphoproliferative disorder.  Nonobstructive right nephrolithiasis. No evidence of ureteral calculi or hydronephrosis.  US Abdomen Complete (03/04/16) FINDINGS: Gallbladder: Gallbladder is contracted. The patient reports not being NPO with the study. Sludge is noted within the gallbladder, and the wall is difficult to visualize.  Common bile duct: Diameter: Normal caliber, 6 mm. The distal duct is obscured by overlying bowel gas.  Liver: Mildly increased echotexture diffusely suggesting fatty infiltration. No biliary ductal dilatation or focal abnormality.  IVC: No abnormality visualized.  Pancreas: Visualized portion unremarkable.  Spleen: Upper limits normal in craniocaudal length of 12.4 cm. Heterogeneous echotexture. There is concern for numerous hyperechoic small nodules throughout the spleen.  Right Kidney: Length: 11.3 cm. Echogenicity within normal limits. No mass or hydronephrosis visualized.  Left Kidney: Length: 12.3 cm. Echogenicity within normal limits. No mass or hydronephrosis visualized.  Abdominal aorta: No aneurysm visualized.  Other findings: Prominent lymph nodes are noted in the porta hepatis region, measuring up to 2.7 cm.  IMPRESSION: Mildly increased echotexture throughout the liver suggesting fatty infiltration. No biliary duct dilatation.  Gallbladder is contracted.  Sludge is noted within the gallbladder.  Heterogeneous echotexture throughout a borderline size spleen with apparent scattered numerous small hyperechoic lesions throughout the spleen. This could be  further evaluated with contrast-enhanced CT to confirm and further  evaluate.  DG Chest Port 1 View (03/04/16) FINDINGS: The heart size and mediastinal contours are within normal limits. Both lungs are clear. The visualized skeletal structures are unremarkable.  IMPRESSION: No active disease.   Smiley Houseman, MD 03/06/2016, 6:17 AM PGY-2, [place] Intern pager: [phone], text pages welcome

## 2016-03-07 DIAGNOSIS — D72819 Decreased white blood cell count, unspecified: Secondary | ICD-10-CM

## 2016-03-07 DIAGNOSIS — D739 Disease of spleen, unspecified: Secondary | ICD-10-CM

## 2016-03-07 DIAGNOSIS — R502 Drug induced fever: Secondary | ICD-10-CM

## 2016-03-07 LAB — CBC
HCT: 32.5 % — ABNORMAL LOW (ref 39.0–52.0)
Hemoglobin: 11.4 g/dL — ABNORMAL LOW (ref 13.0–17.0)
MCH: 27.5 pg (ref 26.0–34.0)
MCHC: 35.1 g/dL (ref 30.0–36.0)
MCV: 78.3 fL (ref 78.0–100.0)
PLATELETS: 65 10*3/uL — AB (ref 150–400)
RBC: 4.15 MIL/uL — AB (ref 4.22–5.81)
RDW: 14.9 % (ref 11.5–15.5)
WBC: 4.3 10*3/uL (ref 4.0–10.5)

## 2016-03-07 LAB — HEMOGLOBINOPATHY EVALUATION
HGB A2 QUANT: 2.5 % (ref 0.7–3.1)
HGB C: 0 %
HGB F QUANT: 0 % (ref 0.0–2.0)
HGB S QUANTITAION: 0 %
Hgb A: 97.5 % (ref 94.0–98.0)

## 2016-03-07 LAB — COMPREHENSIVE METABOLIC PANEL
ALT: 52 U/L (ref 17–63)
ANION GAP: 12 (ref 5–15)
AST: 34 U/L (ref 15–41)
Albumin: 2.7 g/dL — ABNORMAL LOW (ref 3.5–5.0)
Alkaline Phosphatase: 176 U/L — ABNORMAL HIGH (ref 38–126)
BUN: 13 mg/dL (ref 6–20)
CHLORIDE: 102 mmol/L (ref 101–111)
CO2: 20 mmol/L — ABNORMAL LOW (ref 22–32)
CREATININE: 0.8 mg/dL (ref 0.61–1.24)
Calcium: 9.1 mg/dL (ref 8.9–10.3)
Glucose, Bld: 90 mg/dL (ref 65–99)
Potassium: 4.6 mmol/L (ref 3.5–5.1)
Sodium: 134 mmol/L — ABNORMAL LOW (ref 135–145)
Total Bilirubin: 2.2 mg/dL — ABNORMAL HIGH (ref 0.3–1.2)
Total Protein: 6.7 g/dL (ref 6.5–8.1)

## 2016-03-07 NOTE — Progress Notes (Signed)
    [facility]   Reason for visit: Follow up on fever  Interval History: wbc normalizing, Platelets improving, TTE without vegetation, cardiology to see for consideration of TEE  Physical Exam: Constitutional:  Vitals:   03/07/16 0530 03/07/16 1025  BP: 113/69 122/80  Pulse: 81 88  Resp: 17 20  Temp: 98.2 F (36.8 C) 98 F (36.7 C)   patient appears in NAD Respiratory: Normal respiratory effort; CTA B Cardiovascular: RRR GI: soft  Review of Systems: Constitutional: negative for fevers and chills Gastrointestinal: negative for diarrhea Integument/breast: negative for rash  Lab Results  Component Value Date   WBC 4.3 03/07/2016   HGB 11.4 (L) 03/07/2016   HCT 32.5 (L) 03/07/2016   MCV 78.3 03/07/2016   PLT 65 (L) 03/07/2016    Lab Results  Component Value Date   CREATININE 0.80 03/07/2016   BUN 13 03/07/2016   NA 134 (L) 03/07/2016   K 4.6 03/07/2016   CL 102 03/07/2016   CO2 20 (L) 03/07/2016    Lab Results  Component Value Date   ALT 52 03/07/2016   AST 34 03/07/2016   ALKPHOS 176 (H) 03/07/2016     Microbiology: Recent Results (from the past 240 hour(s))  Blood culture (routine x 2)     Status: None (Preliminary result)   Collection Time: 03/04/16 12:21 PM  Result Value Ref Range Status   Specimen Description BLOOD RIGHT ANTECUBITAL  Final   Special Requests BOTTLES DRAWN AEROBIC AND ANAEROBIC 5ML  Final   Culture NO GROWTH 3 DAYS  Final   Report Status PENDING  Incomplete  Blood culture (routine x 2)     Status: None (Preliminary result)   Collection Time: 03/04/16  1:03 PM  Result Value Ref Range Status   Specimen Description BLOOD RIGHT HAND  Final   Special Requests BOTTLES DRAWN AEROBIC AND ANAEROBIC 5CC  Final   Culture NO GROWTH 3 DAYS  Final   Report Status PENDING  Incomplete    Impression/Plan:  1. Fever - resolved so far off of bactrim. Blood cultures sent this am. Continue off of antibiotcs.  If blood  cultures remain negative, no indication for antibiotics.   2.  Splenic findings - ? If abscess which could be from endocarditis.  Possible TEE by cardiology.  3.  Leukopenia - I suspect related to bactrim

## 2016-03-07 NOTE — Progress Notes (Signed)
Wound dressing changed per wound care orders. Patient tolerated well. Will continue to monitor.

## 2016-03-07 NOTE — Care Management Note (Signed)
Case Management Note  Patient Details  Name: Rich NumberKamran Scheaffer MRN: [id number] Date of Birth: 02/19/1971  Subjective/Objective:                 Patient admitted from home with full thickness abscess to glute. Was getting drained at [facility] walk in clinic as out patient. Has history of 3 abscesses per MD note. Newly noted splenic lacerations, ruling out endocarditis and septic emboli, continues with fevers and IV Abx. ID following and WOC following.    Action/Plan:  CM will speak to patient about preferences for wound care follow up.  Expected Discharge Date:  03/14/16               Expected Discharge Plan:  Home w Home Health Services  In-House Referral:     Discharge planning Services  CM Consult  Post Acute Care Choice:    Choice offered to:     DME Arranged:    DME Agency:     HH Arranged:    HH Agency:     Status of Service:  In process, will continue to follow  If discussed at Long Length of Stay Meetings, dates discussed:    Additional Comments:  Lawerance SabalDebbie Sharlize Hoar, RN 03/07/2016, 3:37 PM

## 2016-03-07 NOTE — Progress Notes (Signed)
Asked to see this patient for possible TEE In the am. Platelets are 65K, talked with Dr. Mayford Knifeurner, will need to recheck plt count in the am, but if it remains low, will likely not do TEE. Keep NPO after midnight.

## 2016-03-07 NOTE — Discharge Summary (Signed)
[facility] Discharge Summary  Patient name: Joshua Wagner Medical record number: [id number] Date of birth: 1970-11-11 Age: 45 y.o. Gender: male Date of Admission: 03/04/2016  Date of Discharge: 03/08/16 Admitting Physician: Dickie La, MD  Primary Care Provider: No PCP Per Patient Consultants: Cardiology, Infectious Disease, Oncology  Indication for Hospitalization: FUO and Jaundice  Discharge Diagnoses/Problem List:  Thrombocytopenia Direct hyperbilirubinemia Right gluteal abscess  Disposition: Home  Discharge Condition: Stable, improved  Discharge Exam:  General: well nourished, well developed, in no acute distress with non-toxic appearance, jaundice CV: regular rate and rhythm, no murmur, rubs, or gallops Lungs: clear to auscultation bilaterally with normal work of breathing Abdomen: soft, non-tenderness without guarding or rebound, no masses or organomegaly palpable, normoactive bowel sounds Skin: warm, dry, gluteal-crease noted packing material, no surrounding erythema or drainage  Extremities: warm and well perfused, normal tone  Brief Hospital Course:  Joshua Wagner is a 45 y.o. male who p/w with FUO and jaundice. PMH is significant for thrombocytopenia.  Patient presented to ED on 11/03 with h/o chills and fevers (max 103F) for 1 week around the time his right gluteal abscess was drained and Bactrim was initiated at the [facility] walk-in clinic. During initial presentation, the abscess was healing appropriately and patient did not present with any symptoms other than jaundice. He was intermittently febrile during his admission and was started on Vanc/Zosyn. BCx were obtained and neg. There was a direct hyperbilirubinemia. CT abd/pelvis showed hyperechoic lesions throughout the spleen, otherwise neg. Labs indicated pancytopenia with platelets at 44,000. Initially, oncology was consulted concerning low cell-lines and unknown h/o thrombocytopenia.  Concerns for infectious endocarditis prompted ID consult. TTE was neg for IE, so cardiology was consulted for TEE which was performed and neg for IE. ID suspected cause was related to drug-induced fever 2/2 to bactrim use causing platelet destruction, therefore leading to jaundice, fevers, the splenic lesions, and pancytopenia. The abx were stopped and the fever ceased. Patients liver enzymes remained normal during admission. Direct bili, alk-phos, leukopenia, anemia, and thrombocytopenia improved. Patient was cleared for discharge with stable platelet count 106,000 and improved jaundice. Patient is to f/u with oncologist Dr. Whitney Muse at [facility] in [place].  Issues for Follow Up:  1. Recheck CBC CMET for pancytopenia, and direct hyperbilirubinemia 2. Patient requested pneumovax out patient per recommendation by Oncologist given splenic condition 3. Make sure patient has f/u with [facility] in [place] for further management of his recurrent thrombocytopenia 4. Patient is to establish a PCP after d/c 5. Ensure bactrim is recorded on allergies for pancytopenia 6. Recheck right gluteal abscess for improvement with packing  Significant Procedures: None  Significant Labs and Imaging:   Recent Labs Lab 03/06/16 0530 03/07/16 0544 03/08/16 0831  WBC 3.6* 4.3 5.1  HGB 10.4* 11.4* 13.2  HCT 30.1* 32.5* 38.4*  PLT 61* 65* 105*    Recent Labs Lab 03/04/16 1227 03/05/16 0529 03/06/16 0530 03/07/16 0544 03/08/16 0831  NA 132* 134* 133* 134* 135  K 3.9 3.9 3.4* 4.6 3.9  CL 102 105 102 102 99*  CO2 21* 24 24 20* 25  GLUCOSE 129* 94 114* 90 96  BUN 13 <5* 7 13 14   CREATININE 1.11 1.07 0.95 0.80 1.02  CALCIUM 8.9 8.3* 8.5* 9.1 9.7  ALKPHOS 216* 189* 178* 176* 199*  AST 36 34 34 34 31  ALT 56 49 49 52 52  ALBUMIN 2.8* 2.2* 2.3* 2.7* 3.0*   UA: amber, large  Hgb and bili, 30 protein, small leuks Urine micro: Ca ox crystals with granular casts and few  bacteria Lipase: 43 Acetaminophen: wnl Lactic acid: 1.53 BCx: NGTD Hepatitis: neg PT/INR: 12.9/0.97 HIV: neg TSH: wnl  Imaging/Diagnostic Tests: CT ABDOMEN PELVIS W CONTRAST (03/04/16) FINDINGS: Lower Chest: No acute findings.  Hepatobiliary: No mass identified. Gallbladder is unremarkable. No evidence of biliary dilatation.  Pancreas: No mass or inflammatory changes.  Spleen: Mild to moderate splenomegaly, with spleen measuring 16 cm in length. Numerous small ill-defined low-attenuation lesions are seen throughout the splenic parenchyma.  Adrenals/Urinary Tract: No masses identified. No evidence of hydronephrosis. Several tiny 2-3 mm nonobstructive right renal calculi are identified. No evidence of ureteral calculi or dilatation. Unopacified urinary bladder is unremarkable in appearance.  Stomach/Bowel: No evidence of obstruction, inflammatory process or abnormal fluid collections. Normal appendix visualized.  Vascular/Lymphatic: No pathologically enlarged lymph nodes. No abdominal aortic aneurysm. Aortic atherosclerosis.  Reproductive: No mass identified.  Other: None.  Musculoskeletal: No suspicious bone lesions identified.  IMPRESSION: Splenomegaly with diffuse involvement by small ill-defined low-attenuation lesions. Differential diagnosis includes splenic abscesses/infectious etiologies, lymphoproliferative disorder, sarcoidosis, and metastatic disease. No other soft tissue masses or lymphadenopathy within the abdomen or pelvis. Recommend correlation for clinical or laboratory signs of infectious etiologies and lymphoproliferative disorder.  Nonobstructive right nephrolithiasis. No evidence of ureteral calculi or hydronephrosis.  US Abdomen Complete (03/04/16) FINDINGS: Gallbladder: Gallbladder is contracted. The patient reports not being NPO with the study. Sludge is noted within the gallbladder, and the wall is difficult to  visualize.  Common bile duct: Diameter: Normal caliber, 6 mm. The distal duct is obscured by overlying bowel gas.  Liver: Mildly increased echotexture diffusely suggesting fatty infiltration. No biliary ductal dilatation or focal abnormality.  IVC: No abnormality visualized.  Pancreas: Visualized portion unremarkable.  Spleen: Upper limits normal in craniocaudal length of 12.4 cm. Heterogeneous echotexture. There is concern for numerous hyperechoic small nodules throughout the spleen.  Right Kidney: Length: 11.3 cm. Echogenicity within normal limits. No mass or hydronephrosis visualized.  Left Kidney: Length: 12.3 cm. Echogenicity within normal limits. No mass or hydronephrosis visualized.  Abdominal aorta: No aneurysm visualized.  Other findings: Prominent lymph nodes are noted in the porta hepatis region, measuring up to 2.7 cm.  IMPRESSION: Mildly increased echotexture throughout the liver suggesting fatty infiltration. No biliary duct dilatation.  Gallbladder is contracted. Sludge is noted within the gallbladder.  Heterogeneous echotexture throughout a borderline size spleen with apparent scattered numerous small hyperechoic lesions throughout the spleen. This could be further evaluated with contrast-enhanced CT to confirm and further evaluate.  DG Chest Port 1 View (03/04/16) FINDINGS: The heart size and mediastinal contours are within normal limits. Both lungs are clear. The visualized skeletal structures are unremarkable.  IMPRESSION: No active disease.  Transthoracic Echocardiography (03/06/16) Study Conclusions - Left ventricle: The cavity size was normal. Wall thickness was normal. Systolic function was normal. The estimated ejection fraction was in the range of 55% to 60%. Wall motion was normal; there were no regional wall motion abnormalities. - Pericardium, extracardiac: A trivial pericardial effusion  was identified.  Impressions: - Normal LV systolic function; probable mild diastolic dysfunction; trace MR and TR; no vegetations.  Results/Tests Pending at Time of Discharge: None  Discharge Medications:    Medication List    STOP taking these medications   sulfamethoxazole-trimethoprim 800-160 MG tablet Commonly known as:  BACTRIM DS,SEPTRA DS     TAKE these medications   GAS-X 80 MG chewable tablet Generic drug:  simethicone Chew 80 mg by mouth every 6 (six) hours as needed for flatulence.   ibuprofen 800 MG tablet Commonly known as:  ADVIL,MOTRIN Take 800 mg by mouth every 8 (eight) hours as needed for fever or mild pain.       Discharge Instructions: Please refer to Patient Instructions section of EMR for full details.  Patient was counseled important signs and symptoms that should prompt return to medical care, changes in medications, dietary instructions, activity restrictions, and follow up appointments.   Follow-Up Appointments: Follow-up Information    [facility] Follow up.   Why:  CALL THIS NUMBER; FOLLOW THE PROMPTS TO GET A PRIMARY CARE PHYSICIAN. Contact information: [phone]       Lovenia Kim, MD. Go on 03/16/2016.   Why:  Please go to your appointment at 2:15 pm on 03/16/2016. Please arrive 15 minutes before your appointment. Contact information: [place] [address] [phone]        Molli Hazard, MD. Schedule an appointment as soon as possible for a visit.   Specialties:  Hematology and Oncology, Oncology Why:  Please call and schedule appointment to establish for follow up concerning your reccurent thrombocytopenia Contact information: [place] [address] [phone]           [place] Bing, DO 03/08/2016, 7:55 PM PGY-1, [place]

## 2016-03-07 NOTE — Progress Notes (Signed)
Family Medicine Teaching Service Daily Progress Note Intern Pager: [phone]  Patient name: Joshua Wagner Medical record number: [id number] Date of birth: 1970/08/08 Age: 45 y.o. Gender: male  Primary Care Provider: No PCP Per Patient Consultants: Oncology, ID  Code Status: FULL  Pt Overview and Major Events to Date:  11/03: Admit for FUO and direct hyperbilirubinemia, CT and U/S showed splenic lesion concerning for abscesses 11/05: Vancomycin/Zosyn stopped due to concern for drug fever, BCx NGTD  Assessment and Plan: Maddox Hlavaty is a 45 y.o. male presenting with fever and jaundice . PMH is significant for thrombocytopenia (has been to an oncologist and had a bone marrow biopsy done 12-13 years ago, but none of the tests came back positive).   #Sepsis, Unknown Origin, Likely Spleen Given Imaging: Abd/pelvic CT and U/S is concerning for splenic abscesses with uncertain source. Afebrile for >48 hrs with neg BCx. ID consulted believe patient experienced drug reaction to Bactrim for gluteal abscess. Off vanc/zosyn d/c'd 11/05 (total 3 days). TEE unremarkable and neg for veg. No leukocytosis as of 11/06. --Cards consulted for consideration for TEE given neg TTE and splenic abscesses concerning for infections endocarditis, will await recs --Trend CBC and CMET --Tylenol 650 mg q6h and Ibuprofen 400 mg q6h PRN mild-moderate pain  #Direct Hyperbilirubinemia, Acute, Improved: Bili levels tot 4.3, direct 2.9, indirect 1.4 upon admission. Alk phos elevated 216 without transaminitis. Lipase wnl. CT abd/pelvis concerning for splenic abscesses, see note above. No biliary ductal dilation or liver pathology noted. Hep panel and HIV neg. Liver enzymes wnl, alk phos trending down 178>176 and direct bili trending down 4.3>3.9 >2.4. ID believe may be 2/2 to bactrim given h/o idiopathic thrombocytopenia and clinical hx. --F/u bili levels --Work up for source of splenic abscesses as above  #Abscess of  Gluteal Crease, Healing, Stable: Has history of previous gluteal abscess many years ago which spontaneously resolved. The current abscess was drained 1 week ago at [facility] with continual repacking and Bactrim. No known h/o Crohns disease. No drainage or signs of infection at site with clean, healing incision. No surrounding erythema, warmth, or fluctuance.  --Trend CBC --Wound care consulted  #Pancytopenia WBC 3.1, Hgb 11.9, Plt 44 during admission with decrease in absolute lymphs. H/o thrombocytopenia, see above. Uncertain chronicity of this given patient does not see a physician regularly and has not seen hematology in 12 years.  Blood smear with anisocytosis with some microcytic RBC w/ decreased WBC and plt.  Plt increasing 61>65, leukopenia resolved, and Hgb still improving as well. Oncology consulted, appreciate recc: suspect chronic immune thrombocytopenia. Suspect infectious process for other symptoms, less suspicious for malignancy. Patient may have hemoglobinopathy-consider checking iron studies --F/u CBC and CMET --F/u Oncology recs  --Hemoglobinopathy evaluation pending  #Hypokalemia, Acute, Resolved: --Replete Kdur 38mq x 1 if low  FEN/GI: Cardiac diet, saline lock Prophylaxis: SCDs  Disposition: Pending improvement of direct hyperbilirubinemia, thrombocytopenia, and possible TEE  Subjective:  No events overnight. Afebrile for at least 48 hours. Vitals stable. Patient doing well. Reports no abdominal pain. Normal BMs. No dysuria.   Objective: Temp:  [97.6 F (36.4 C)-98.2 F (36.8 C)] 98.2 F (36.8 C) (11/06 0530) Pulse Rate:  [81-92] 81 (11/06 0530) Resp:  [16-17] 17 (11/06 0530) BP: (113-141)/(69-90) 113/69 (11/06 0530) SpO2:  [97 %] 97 % (11/06 0530) Physical Exam: General: well nourished, well developed, in no acute distress with non-toxic appearance, jaundice CV: regular rate and rhythm, no murmur, rubs, or gallops Lungs: clear to auscultation  bilaterally  with normal work of breathing Abdomen: soft, non-tenderness without guarding or rebound, no masses or organomegaly palpable, normoactive bowel sounds Skin: warm, dry, gluteal-crease noted packing material, no surrounding erythema or drainage  Extremities: warm and well perfused, normal tone  Laboratory:  Recent Labs Lab 03/04/16 1227 03/05/16 0529 03/06/16 0530  WBC 3.1* 2.8* 3.6*  HGB 11.9* 10.2* 10.4*  HCT 33.5* 29.7* 30.1*  PLT 44* 52* 61*    Recent Labs Lab 03/04/16 1227 03/04/16 1406 03/05/16 0529 03/06/16 0530  NA 132*  --  134* 133*  K 3.9  --  3.9 3.4*  CL 102  --  105 102  CO2 21*  --  24 24  BUN 13  --  <5* 7  CREATININE 1.11  --  1.07 0.95  CALCIUM 8.9  --  8.3* 8.5*  PROT 6.1*  --  5.4* 5.7*  BILITOT 4.8* 4.3* 3.9* 2.4*  ALKPHOS 216*  --  189* 178*  ALT 56  --  49 49  AST 36  --  34 34  GLUCOSE 129*  --  94 114*   Lab Results  Component Value Date   IRON 35 (L) 03/05/2016   TIBC 267 03/05/2016   FERRITIN 97 03/05/2016   UA: amber, large Hgb and bili, 30 protein, small leuks Urine micro: Ca ox crystals with granular casts and few bacteria Lipase: 43 Acetaminophen: wnl Lactic acid: 1.53 BCx: NGTD Hepatitis: neg PT/INR: 12.9/0.97 HIV: neg TSH: wnl  Imaging/Diagnostic Tests: CT ABDOMEN PELVIS W CONTRAST (03/04/16) FINDINGS: Lower Chest: No acute findings.  Hepatobiliary: No mass identified. Gallbladder is unremarkable. No evidence of biliary dilatation.  Pancreas:  No mass or inflammatory changes.  Spleen: Mild to moderate splenomegaly, with spleen measuring 16 cm in length. Numerous small ill-defined low-attenuation lesions are seen throughout the splenic parenchyma.  Adrenals/Urinary Tract: No masses identified. No evidence of hydronephrosis. Several tiny 2-3 mm nonobstructive right renal calculi are identified. No evidence of ureteral calculi or dilatation. Unopacified urinary bladder is unremarkable  in appearance.  Stomach/Bowel: No evidence of obstruction, inflammatory process or abnormal fluid collections. Normal appendix visualized.  Vascular/Lymphatic: No pathologically enlarged lymph nodes. No abdominal aortic aneurysm. Aortic atherosclerosis.  Reproductive:  No mass identified.  Other:  None.  Musculoskeletal:  No suspicious bone lesions identified.  IMPRESSION: Splenomegaly with diffuse involvement by small ill-defined low-attenuation lesions. Differential diagnosis includes splenic abscesses/infectious etiologies, lymphoproliferative disorder, sarcoidosis, and metastatic disease. No other soft tissue masses or lymphadenopathy within the abdomen or pelvis. Recommend correlation for clinical or laboratory signs of infectious etiologies and lymphoproliferative disorder.  Nonobstructive right nephrolithiasis. No evidence of ureteral calculi or hydronephrosis.  US Abdomen Complete (03/04/16) FINDINGS: Gallbladder: Gallbladder is contracted. The patient reports not being NPO with the study. Sludge is noted within the gallbladder, and the wall is difficult to visualize.  Common bile duct: Diameter: Normal caliber, 6 mm. The distal duct is obscured by overlying bowel gas.  Liver: Mildly increased echotexture diffusely suggesting fatty infiltration. No biliary ductal dilatation or focal abnormality.  IVC: No abnormality visualized.  Pancreas: Visualized portion unremarkable.  Spleen: Upper limits normal in craniocaudal length of 12.4 cm. Heterogeneous echotexture. There is concern for numerous hyperechoic small nodules throughout the spleen.  Right Kidney: Length: 11.3 cm. Echogenicity within normal limits. No mass or hydronephrosis visualized.  Left Kidney: Length: 12.3 cm. Echogenicity within normal limits. No mass or hydronephrosis visualized.  Abdominal aorta: No aneurysm visualized.  Other findings: Prominent lymph nodes are noted in the  porta  hepatis region, measuring up to 2.7 cm.  IMPRESSION: Mildly increased echotexture throughout the liver suggesting fatty infiltration. No biliary duct dilatation.  Gallbladder is contracted.  Sludge is noted within the gallbladder.  Heterogeneous echotexture throughout a borderline size spleen with apparent scattered numerous small hyperechoic lesions throughout the spleen. This could be further evaluated with contrast-enhanced CT to confirm and further evaluate.  DG Chest Port 1 View (03/04/16) FINDINGS: The heart size and mediastinal contours are within normal limits. Both lungs are clear. The visualized skeletal structures are unremarkable.  IMPRESSION: No active disease.  Transthoracic Echocardiography (03/06/16) Study Conclusions - Left ventricle: The cavity size was normal. Wall thickness was   normal. Systolic function was normal. The estimated ejection   fraction was in the range of 55% to 60%. Wall motion was normal;   there were no regional wall motion abnormalities. - Pericardium, extracardiac: A trivial pericardial effusion was   identified.  Impressions: - Normal LV systolic function; probable mild diastolic dysfunction;   trace MR and TR; no vegetations.    [place] Bing, DO 03/07/2016, 7:29 AM PGY-1, [place] Intern pager: [phone], text pages welcome

## 2016-03-08 ENCOUNTER — Encounter (HOSPITAL_COMMUNITY): Payer: Self-pay | Admitting: *Deleted

## 2016-03-08 ENCOUNTER — Encounter (HOSPITAL_COMMUNITY)
Admission: EM | Disposition: A | Discharge status: Home / Self Care | Payer: Self-pay | Source: Home / Self Care | Attending: Family Medicine

## 2016-03-08 ENCOUNTER — Inpatient Hospital Stay (HOSPITAL_COMMUNITY): Payer: BLUE CROSS/BLUE SHIELD

## 2016-03-08 DIAGNOSIS — R7881 Bacteremia: Secondary | ICD-10-CM

## 2016-03-08 HISTORY — PX: TEE WITHOUT CARDIOVERSION: SHX5443

## 2016-03-08 LAB — COMPREHENSIVE METABOLIC PANEL
ALBUMIN: 3 g/dL — AB (ref 3.5–5.0)
ALK PHOS: 199 U/L — AB (ref 38–126)
ALT: 52 U/L (ref 17–63)
ANION GAP: 11 (ref 5–15)
AST: 31 U/L (ref 15–41)
BUN: 14 mg/dL (ref 6–20)
CALCIUM: 9.7 mg/dL (ref 8.9–10.3)
CO2: 25 mmol/L (ref 22–32)
CREATININE: 1.02 mg/dL (ref 0.61–1.24)
Chloride: 99 mmol/L — ABNORMAL LOW (ref 101–111)
GFR calc Af Amer: >60 mL/min (ref >60)
GFR calc non Af Amer: >60 mL/min (ref >60)
GLUCOSE: 96 mg/dL (ref 65–99)
Potassium: 3.9 mmol/L (ref 3.5–5.1)
SODIUM: 135 mmol/L (ref 135–145)
Total Bilirubin: 1.7 mg/dL — ABNORMAL HIGH (ref 0.3–1.2)
Total Protein: 7.7 g/dL (ref 6.5–8.1)

## 2016-03-08 LAB — CBC
HCT: 38.4 % — ABNORMAL LOW (ref 39.0–52.0)
HEMOGLOBIN: 13.2 g/dL (ref 13.0–17.0)
MCH: 27.8 pg (ref 26.0–34.0)
MCHC: 34.4 g/dL (ref 30.0–36.0)
MCV: 81 fL (ref 78.0–100.0)
Platelets: 105 10*3/uL — ABNORMAL LOW (ref 150–400)
RBC: 4.74 MIL/uL (ref 4.22–5.81)
RDW: 14.9 % (ref 11.5–15.5)
WBC: 5.1 10*3/uL (ref 4.0–10.5)

## 2016-03-08 LAB — PATHOLOGIST SMEAR REVIEW

## 2016-03-08 SURGERY — ECHOCARDIOGRAM, TRANSESOPHAGEAL
Anesthesia: Moderate Sedation

## 2016-03-08 MED ORDER — BUTAMBEN-TETRACAINE-BENZOCAINE 2-2-14 % EX AERO
INHALATION_SPRAY | CUTANEOUS | Status: DC | PRN
  Administered 2016-03-08: 2 via TOPICAL

## 2016-03-08 MED ORDER — MIDAZOLAM HCL 5 MG/ML IJ SOLN
INTRAMUSCULAR | Status: AC
  Filled 2016-03-08: qty 2

## 2016-03-08 MED ORDER — MIDAZOLAM HCL 10 MG/2ML IJ SOLN
INTRAMUSCULAR | Status: DC | PRN
  Administered 2016-03-08 (×2): 2 mg via INTRAVENOUS

## 2016-03-08 MED ORDER — INFLUENZA VAC SPLIT QUAD 0.5 ML IM SUSY
0.5000 mL | PREFILLED_SYRINGE | Freq: Once | INTRAMUSCULAR | Status: AC
  Administered 2016-03-08: 0.5 mL via INTRAMUSCULAR
  Filled 2016-03-08: qty 0.5

## 2016-03-08 MED ORDER — SODIUM CHLORIDE 0.9 % IV SOLN
INTRAVENOUS | Status: DC
  Administered 2016-03-08: 10:00:00 via INTRAVENOUS

## 2016-03-08 MED ORDER — INFLUENZA VAC SPLIT QUAD 0.5 ML IM SUSY: 0.5000 mL | PREFILLED_SYRINGE | INTRAMUSCULAR | Status: DC

## 2016-03-08 MED ORDER — FENTANYL CITRATE (PF) 100 MCG/2ML IJ SOLN
INTRAMUSCULAR | Status: DC | PRN
  Administered 2016-03-08 (×2): 12.5 ug via INTRAVENOUS
  Administered 2016-03-08: 25 ug via INTRAVENOUS

## 2016-03-08 MED ORDER — FENTANYL CITRATE (PF) 100 MCG/2ML IJ SOLN
INTRAMUSCULAR | Status: AC
  Filled 2016-03-08: qty 2

## 2016-03-08 NOTE — Progress Notes (Signed)
Pt. baqck from TEE, wife had some questions for MD. Paged [phone].  Will await for return call.  Forbes Cellarelcine Natasha Burda, RN

## 2016-03-08 NOTE — Progress Notes (Signed)
Joshua Wagner discharged Home per MD order.  Discharge instructions reviewed and discussed with the patient, all questions and concerns answered. Copy of instructions and care notes given to patient.    Medication List    STOP taking these medications   sulfamethoxazole-trimethoprim 800-160 MG tablet Commonly known as:  BACTRIM DS,SEPTRA DS     TAKE these medications   GAS-X 80 MG chewable tablet Generic drug:  simethicone Chew 80 mg by mouth every 6 (six) hours as needed for flatulence.   ibuprofen 800 MG tablet Commonly known as:  ADVIL,MOTRIN Take 800 mg by mouth every 8 (eight) hours as needed for fever or mild pain.       Patients skin is clean, dry and intact, no evidence of skin break down. IV site discontinued and catheter remains intact. Site without signs and symptoms of complications. Dressing and pressure applied.  Dressing change to sacrum and instrusted wife how to change it.   Patient escorted to car by NT in a wheelchair,  no distress noted upon discharge.  Joshua Wagner, Joshua Wagner 03/08/2016 4:29 PM

## 2016-03-08 NOTE — Progress Notes (Signed)
    [facility]   Reason for visit: Follow up on fever  Interval History: wbc normalizing, Platelets improving, TEE without vegetation  Physical Exam: Constitutional:  Vitals:   03/08/16 1210 03/08/16 1220  BP: 123/82 118/70  Pulse: 88 77  Resp: 16 16  Temp:  97.3 F (36.3 C)   patient appears in NAD Skin: no rashes Pulmonary: normal respiratory effort  Review of Systems: Constitutional: negative for fevers and chills Gastrointestinal: negative for diarrhea Integument/breast: negative for rash  Lab Results  Component Value Date   WBC 5.1 03/08/2016   HGB 13.2 03/08/2016   HCT 38.4 (L) 03/08/2016   MCV 81.0 03/08/2016   PLT 105 (L) 03/08/2016    Lab Results  Component Value Date   CREATININE 1.02 03/08/2016   BUN 14 03/08/2016   NA 135 03/08/2016   K 3.9 03/08/2016   CL 99 (L) 03/08/2016   CO2 25 03/08/2016    Lab Results  Component Value Date   ALT 52 03/08/2016   AST 31 03/08/2016   ALKPHOS 199 (H) 03/08/2016     Microbiology: Recent Results (from the past 240 hour(s))  Blood culture (routine x 2)     Status: None (Preliminary result)   Collection Time: 03/04/16 12:21 PM  Result Value Ref Range Status   Specimen Description BLOOD RIGHT ANTECUBITAL  Final   Special Requests BOTTLES DRAWN AEROBIC AND ANAEROBIC 5ML  Final   Culture NO GROWTH 3 DAYS  Final   Report Status PENDING  Incomplete  Blood culture (routine x 2)     Status: None (Preliminary result)   Collection Time: 03/04/16  1:03 PM  Result Value Ref Range Status   Specimen Description BLOOD RIGHT HAND  Final   Special Requests BOTTLES DRAWN AEROBIC AND ANAEROBIC 5CC  Final   Culture NO GROWTH 3 DAYS  Final   Report Status PENDING  Incomplete    Impression/Plan:  1. Fever - resolved so far off of bactrim.  2.  Splenic findings - Not c/w septic emboli without vegetation noted.   3.  Leukopenia - resolved  I will sign off, call with questions. thanks

## 2016-03-08 NOTE — Interval H&P Note (Signed)
History and Physical Interval Note:  03/08/2016 11:29 AM  Joshua Wagner  has presented today for surgery, with the diagnosis of BACTEREMIA  The various methods of treatment have been discussed with the patient and family. After consideration of risks, benefits and other options for treatment, the patient has consented to  Procedure(s): TRANSESOPHAGEAL ECHOCARDIOGRAM (TEE) (N/A) as a surgical intervention .  The patient's history has been reviewed, patient examined, no change in status, stable for surgery.  I have reviewed the patient's chart and labs.  Questions were answered to the patient's satisfaction.     Armanda Magicraci Leondro Coryell

## 2016-03-08 NOTE — Discharge Instructions (Signed)
You were admitted for fevers and jaundice. Your bilirubin levels were elevated and you platelets were low. We performed a CT of your abdomen which showed lesions in the spleen consistent with thrombocytopenia. Given your history we included hematology who felt this was a reaction to you platelets being destroyed. Infectious disease determined the cause was bactrim which you took for your antibiotic. Once this was discontinued you fevers resolved and your platelets improved. Cardiology ruled out infectious endocarditis.

## 2016-03-08 NOTE — Progress Notes (Signed)
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 His bilirubin is coming down. This may be from Bactrim. This was discontinued.  He's been afebrile. Cultures are normal.  He had an echocardiogram done which did not show anything obvious. He is scattered for a TEE. His platelet 65,000 yesterday. I really think this would be acceptable for a TEE. There is no labs back yet today.  He has had no rashes. He's had no cough or shortness of breath. He's had no pain.   On his physical exam, his blood pressure is 120/84. His temperature is 98.1. His pulse is 77. His oral exam shows no intraoral lesions. He has no adenopathy in the neck. Lungs are clear bilaterally. Cardiac exam regular rate and rhythm with no murmurs, rubs or bruits.. Abdomen is soft. I really cannot palpate his liver or spleen tip. Extremities shows no clubbing, cyanosis or edema. Neurological exam shows no focal neurological deficits. Skin exam shows no rashes, ecchymoses or petechia.  From my point of view, this all may have been a viral type syndrome. I'm sure we will never find out what exactly triggered this elevated bilirubin and dropped his blood counseled bit. They seem to be recovering quite nicely on their own. He is on no colony factor stimulation.  Hopefully, he will be able to go home today or tomorrow the latest. He does not need a lower test from my point of view.  If he needs any tablet hematology follow-up, since he lives in [place], he can go see Dr. Galen ManilaPenland at the [facility] in [place].  His wife was with him today. I spoke with her.  Christin BachPete Ennever, MD  1 thessalonians 5:16-18

## 2016-03-08 NOTE — Care Management Note (Signed)
Case Management Note  Patient Details  Name: Joshua Wagner MRN: [id number] Date of Birth: 08-14-1970  Subjective/Objective:                 Spoke with paient and wife at the bedside. They agreed that wife can manage wound to buttock at [facility]. Wife requested instruction on dressing changes, CM spoke with nurse Delcine about request. No other home needs identified. Privately insured patient provided with instructions on how to find PCP accepting his insurance.   Action/Plan:  DC to home today, no further CM needs.  Expected Discharge Date:  03/14/16               Expected Discharge Plan:  Home/Self Care  In-House Referral:  NA  Discharge planning Services  CM Consult  Post Acute Care Choice:  NA Choice offered to:  NA  DME Arranged:    DME Agency:     HH Arranged:    HH Agency:     Status of Service:  Completed, signed off  If discussed at [employer] Length of Stay Meetings, dates discussed:    Additional Comments:  Lawerance SabalDebbie Kaion Tisdale, RN 03/08/2016, 2:49 PM

## 2016-03-08 NOTE — CV Procedure (Addendum)
    PROCEDURE NOTE:  Procedure:  Transesophageal echocardiogram Operator:  Armanda Magicraci Turner, MD Indications:  Bacteremia/sepsis Complications: None  During this procedure the patient is administered a total of Versed 4 mg and Fentanyl 50 mg to achieve and maintain moderate conscious sedation.  The patient's heart rate, blood pressure, and oxygen saturation are monitored continuously during the procedure. The period of conscious sedation is 15 minutes, of which I was present face-to-face 100% of this time.  Results: Normal LV size and function EF 55-60% Normal RV size and function Normal RA Normal LA Normal TV with trivial TR Normal PV with trivial PR Normal MV with trivial MR Normal trileaflet AV with trivial AR Normal interatrial septum with no evidence of shunt by colorflow dopper  Normal thoracic and ascending aorta.  No evidence of vegetation  The patient tolerated the procedure well and was transferred back to their room in stable condition.  Signed: Armanda Magicraci Turner, MD [facility]

## 2016-03-08 NOTE — Progress Notes (Signed)
  Echocardiogram 2D Echocardiogram has been performed.  Joshua Wagner, Joshua Wagner 03/08/2016, 12:19 PM

## 2016-03-08 NOTE — Progress Notes (Signed)
Family Medicine Teaching Service Daily Progress Note Intern Pager: [phone]  Patient name: Joshua Wagner Medical record number: [id number] Date of birth: 14-Apr-1971 Age: 45 y.o. Gender: male  Primary Care Provider: No PCP Per Patient Consultants: Oncology, ID  Code Status: FULL  Pt Overview and Major Events to Date:  11/03: Admit for FUO and direct hyperbilirubinemia, CT and U/S showed splenic lesion concerning for abscesses 11/05: Vancomycin/Zosyn stopped due to concern for drug fever, BCx NGTD 11/07: TEE to r/o endocarditis was neg  Assessment and Plan: Joshua Wagner is a 45 y.o. male presenting with fever and jaundice . PMH is significant for thrombocytopenia (has been to an oncologist and had a bone marrow biopsy done 12-13 years ago, but none of the tests came back positive).   #Sepsis, Resolved, Likely 2/2 Drug/Fever and Thrombocytopenia: Abd/pelvic CT and U/S is concerning for splenic abscesses with uncertain source. Afebrile for >72 hrs with BCx NGTD, 2nd BCx obtained pending. ID consulted believe patient experienced drug reaction to Bactrim for gluteal abscess. Off vanc/zosyn d/c'd 11/05 (total 3 days). TEE unremarkable and neg for veg. No leukocytosis as of 11/06. --Cards consulted for consideration for TEE given neg TTE and splenic abscesses concerning for infections endocarditis, no infectious endocarditis found on image --Trend CBC and CMET --Tylenol 650 mg q6h and Ibuprofen 400 mg q6h PRN mild-moderate pain  #Direct Hyperbilirubinemia, Acute, Improved: Bili levels tot 4.3, direct 2.9, indirect 1.4 upon admission. Alk phos elevated 216 without transaminitis. Lipase wnl. CT abd/pelvis concerning for splenic abscesses, see note above. No biliary ductal dilation or liver pathology noted. Hep panel and HIV neg. Liver enzymes wnl, alk phos at 199 and bili trending down 2.2>1.7. ID believe may be 2/2 to bactrim given h/o idiopathic thrombocytopenia and clinical hx. --F/u bili  levels --Work up for source of splenic abscesses as above  #Abscess of Gluteal Crease, Healing, Stable: Has history of previous gluteal abscess many years ago which spontaneously resolved. The current abscess was drained 1 week ago at [facility] with continual repacking and Bactrim. No known h/o Crohns disease. No drainage or signs of infection at site with clean, healing incision. No surrounding erythema, warmth, or fluctuance.  --Trend CBC --Wound care consulted  #Pancytopenia WBC 3.1, Hgb 11.9, Plt 44 during admission with decrease in absolute lymphs. H/o thrombocytopenia, see above. Uncertain chronicity of this given patient does not see a physician regularly and has not seen hematology in 12 years.  Blood smear with anisocytosis with some microcytic RBC w/ decreased WBC and plt.  Plt increasing 65>105, leukopenia resolved, and Hgb still improving as well. Oncology consulted, appreciate recc: suspect chronic immune thrombocytopenia. Suspect infectious process for other symptoms, less suspicious for malignancy. Patient may have hemoglobinopathy-consider checking iron studies. Hemoglobinopathy evaluation negative, pending smear review. --F/u CBC and CMET --Oncology consulted, appreciate recs  #Hypokalemia, Acute, Resolved: --Replete Kdur 31mq x 1 if low  FEN/GI: Regular diet, saline lock Prophylaxis: SCDs  Disposition: Neg TEE for infectious endocarditis, anticipate d/c today  Subjective:  No overnight events. VSS and afebrile for past 72 hours. Patient is lying comfortably in bed without complaints. Denies fevers, chills, chest pain, dyspnea, abdominal pain, change in bowel habit.   Objective: Temp:  [97.7 F (36.5 C)-98.4 F (36.9 C)] 98.1 F (36.7 C) (11/07 0508) Pulse Rate:  [77-88] 77 (11/07 0508) Resp:  [18-20] 18 (11/07 0508) BP: (121-129)/(80-84) 121/84 (11/07 0508) SpO2:  [97 %-99 %] 97 % (11/07 0508) Physical Exam: General: well nourished, well developed,  in  no acute distress with non-toxic appearance, jaundice CV: regular rate and rhythm, no murmur, rubs, or gallops Lungs: clear to auscultation bilaterally with normal work of breathing Abdomen: soft, non-tenderness without guarding or rebound, no masses or organomegaly palpable, normoactive bowel sounds Skin: warm, dry, gluteal-crease noted packing material, no surrounding erythema or drainage  Extremities: warm and well perfused, normal tone  Laboratory:  Recent Labs Lab 03/06/16 0530 03/07/16 0544 03/08/16 0831  WBC 3.6* 4.3 5.1  HGB 10.4* 11.4* 13.2  HCT 30.1* 32.5* 38.4*  PLT 61* 65* 105*    Recent Labs Lab 03/05/16 0529 03/06/16 0530 03/07/16 0544  NA 134* 133* 134*  K 3.9 3.4* 4.6  CL 105 102 102  CO2 24 24 20*  BUN <5* 7 13  CREATININE 1.07 0.95 0.80  CALCIUM 8.3* 8.5* 9.1  PROT 5.4* 5.7* 6.7  BILITOT 3.9* 2.4* 2.2*  ALKPHOS 189* 178* 176*  ALT 49 49 52  AST 34 34 34  GLUCOSE 94 114* 90   Lab Results  Component Value Date   IRON 35 (L) 03/05/2016   TIBC 267 03/05/2016   FERRITIN 97 03/05/2016   UA: amber, large Hgb and bili, 30 protein, small leuks Urine micro: Ca ox crystals with granular casts and few bacteria Lipase: 43 Acetaminophen: wnl Lactic acid: 1.53 BCx: NGTD Hepatitis: neg PT/INR: 12.9/0.97 HIV: neg TSH: wnl  Imaging/Diagnostic Tests: CT ABDOMEN PELVIS W CONTRAST (03/04/16) FINDINGS: Lower Chest: No acute findings.  Hepatobiliary: No mass identified. Gallbladder is unremarkable. No evidence of biliary dilatation.  Pancreas:  No mass or inflammatory changes.  Spleen: Mild to moderate splenomegaly, with spleen measuring 16 cm in length. Numerous small ill-defined low-attenuation lesions are seen throughout the splenic parenchyma.  Adrenals/Urinary Tract: No masses identified. No evidence of hydronephrosis. Several tiny 2-3 mm nonobstructive right renal calculi are identified. No evidence of ureteral calculi or dilatation.  Unopacified urinary bladder is unremarkable in appearance.  Stomach/Bowel: No evidence of obstruction, inflammatory process or abnormal fluid collections. Normal appendix visualized.  Vascular/Lymphatic: No pathologically enlarged lymph nodes. No abdominal aortic aneurysm. Aortic atherosclerosis.  Reproductive:  No mass identified.  Other:  None.  Musculoskeletal:  No suspicious bone lesions identified.  IMPRESSION: Splenomegaly with diffuse involvement by small ill-defined low-attenuation lesions. Differential diagnosis includes splenic abscesses/infectious etiologies, lymphoproliferative disorder, sarcoidosis, and metastatic disease. No other soft tissue masses or lymphadenopathy within the abdomen or pelvis. Recommend correlation for clinical or laboratory signs of infectious etiologies and lymphoproliferative disorder.  Nonobstructive right nephrolithiasis. No evidence of ureteral calculi or hydronephrosis.  US Abdomen Complete (03/04/16) FINDINGS: Gallbladder: Gallbladder is contracted. The patient reports not being NPO with the study. Sludge is noted within the gallbladder, and the wall is difficult to visualize.  Common bile duct: Diameter: Normal caliber, 6 mm. The distal duct is obscured by overlying bowel gas.  Liver: Mildly increased echotexture diffusely suggesting fatty infiltration. No biliary ductal dilatation or focal abnormality.  IVC: No abnormality visualized.  Pancreas: Visualized portion unremarkable.  Spleen: Upper limits normal in craniocaudal length of 12.4 cm. Heterogeneous echotexture. There is concern for numerous hyperechoic small nodules throughout the spleen.  Right Kidney: Length: 11.3 cm. Echogenicity within normal limits. No mass or hydronephrosis visualized.  Left Kidney: Length: 12.3 cm. Echogenicity within normal limits. No mass or hydronephrosis visualized.  Abdominal aorta: No aneurysm visualized.  Other  findings: Prominent lymph nodes are noted in the porta hepatis region, measuring up to 2.7 cm.  IMPRESSION: Mildly increased echotexture throughout the liver suggesting fatty  infiltration. No biliary duct dilatation.  Gallbladder is contracted.  Sludge is noted within the gallbladder.  Heterogeneous echotexture throughout a borderline size spleen with apparent scattered numerous small hyperechoic lesions throughout the spleen. This could be further evaluated with contrast-enhanced CT to confirm and further evaluate.  DG Chest Port 1 View (03/04/16) FINDINGS: The heart size and mediastinal contours are within normal limits. Both lungs are clear. The visualized skeletal structures are unremarkable.  IMPRESSION: No active disease.  Transthoracic Echocardiography (03/06/16) Study Conclusions - Left ventricle: The cavity size was normal. Wall thickness was   normal. Systolic function was normal. The estimated ejection   fraction was in the range of 55% to 60%. Wall motion was normal;   there were no regional wall motion abnormalities. - Pericardium, extracardiac: A trivial pericardial effusion was   identified.  Impressions: - Normal LV systolic function; probable mild diastolic dysfunction;   trace MR and TR; no vegetations.    [place] Bing, DO 03/08/2016, 9:35 AM PGY-1, [place] Intern pager: [phone], text pages welcome

## 2016-03-09 LAB — CULTURE, BLOOD (ROUTINE X 2)
Culture: NO GROWTH
Culture: NO GROWTH

## 2016-03-10 ENCOUNTER — Inpatient Hospital Stay: Payer: BLUE CROSS/BLUE SHIELD | Admitting: Family Medicine

## 2016-03-10 ENCOUNTER — Encounter (HOSPITAL_COMMUNITY): Payer: Self-pay | Admitting: Cardiology

## 2016-03-12 LAB — CULTURE, BLOOD (ROUTINE X 2)
CULTURE: NO GROWTH
Culture: NO GROWTH

## 2016-03-16 ENCOUNTER — Encounter: Payer: Self-pay | Admitting: Family Medicine

## 2016-03-16 ENCOUNTER — Ambulatory Visit (INDEPENDENT_AMBULATORY_CARE_PROVIDER_SITE_OTHER): Payer: BLUE CROSS/BLUE SHIELD | Admitting: Family Medicine

## 2016-03-16 ENCOUNTER — Ambulatory Visit: Payer: BLUE CROSS/BLUE SHIELD | Admitting: Family Medicine

## 2016-03-16 VITALS — BP 139/85 | HR 93 | Temp 97.6°F | Ht 70.0 in | Wt 179.0 lb

## 2016-03-16 DIAGNOSIS — D693 Immune thrombocytopenic purpura: Secondary | ICD-10-CM

## 2016-03-16 DIAGNOSIS — D696 Thrombocytopenia, unspecified: Secondary | ICD-10-CM

## 2016-03-16 DIAGNOSIS — K611 Rectal abscess: Secondary | ICD-10-CM

## 2016-03-16 LAB — CBC
HEMATOCRIT: 35 % — AB (ref 38.5–50.0)
Hemoglobin: 11.9 g/dL — ABNORMAL LOW (ref 13.2–17.1)
MCH: 27.5 pg (ref 27.0–33.0)
MCHC: 34 g/dL (ref 32.0–36.0)
MCV: 80.8 fL (ref 80.0–100.0)
MPV: 9.2 fL (ref 7.5–12.5)
Platelets: 122 10*3/uL — ABNORMAL LOW (ref 140–400)
RBC: 4.33 MIL/uL (ref 4.20–5.80)
RDW: 15.5 % — AB (ref 11.0–15.0)
WBC: 3.1 10*3/uL — AB (ref 3.8–10.8)

## 2016-03-16 LAB — COMPLETE METABOLIC PANEL WITH GFR
ALK PHOS: 119 U/L — AB (ref 40–115)
ALT: 28 U/L (ref 9–46)
AST: 21 U/L (ref 10–40)
Albumin: 4 g/dL (ref 3.6–5.1)
BUN: 17 mg/dL (ref 7–25)
CALCIUM: 9.6 mg/dL (ref 8.6–10.3)
CHLORIDE: 99 mmol/L (ref 98–110)
CO2: 29 mmol/L (ref 20–31)
Creat: 0.99 mg/dL (ref 0.60–1.35)
Glucose, Bld: 110 mg/dL — ABNORMAL HIGH (ref 65–99)
POTASSIUM: 3.5 mmol/L (ref 3.5–5.3)
Sodium: 137 mmol/L (ref 135–146)
Total Bilirubin: 1.2 mg/dL (ref 0.2–1.2)
Total Protein: 7.3 g/dL (ref 6.1–8.1)

## 2016-03-16 NOTE — Progress Notes (Signed)
Subjective:   Patient ID: Joshua Wagner    DOB: [birth year], 45 y.o. male   MRN: [id number]  HPI: Joshua Wagner is a 45 y.o. male who presents to clinic today for hospital follow up.   Problems discussed today are as follows:   Patient recently discharged following hospitalization for gluteal abscess and fevers.  States he has been doing well.  Denies fevers at home.  No pain following I&D about 2 weeks ago.  Reports drainage from the site is minimal.  Also minimal bleeding, has been changing bandages daily.  Denies pain with bowel movements.  Was not discharged with antibiotics as his blood cultures were negative.  Patient to follow up outpatient with oncologist Dr. Whitney Muse at [facility] in [place].  Patient with abnormal labs on admission.  At discharge, direct bili, alk phos, leukopenia, anemia and thrombocytopenia all resolved.  Will recheck platelet count and LFTs today.   Still complaining of some itching of arms and legs.  No jaundice on exam.    Has not made appointment with [facility] in [place] for hematology follow up.  Patient states he will make it this week.  Will find a PCP closer to his house, not at [facility].    ROS: See HPI for pertinent ROS.  [place]: Pertinent past medical, surgical, family, and social history were reviewed and updated as appropriate. Smoking status reviewed.  Medications reviewed. Current Outpatient Prescriptions  Medication Sig Dispense Refill  . ibuprofen (ADVIL,MOTRIN) 800 MG tablet Take 800 mg by mouth every 8 (eight) hours as needed for fever or mild pain.    . simethicone (GAS-X) 80 MG chewable tablet Chew 80 mg by mouth every 6 (six) hours as needed for flatulence.     No current facility-administered medications for this visit.    Objective:   BP 139/85   Pulse 93   Temp 97.6 F (36.4 C) (Oral)   Ht _0  (1.778 m)   Wt 179 lb (81.2 kg)   BMI 25.68 kg/m  Vitals and nursing note reviewed.  General: well nourished, well  developed, in no acute distress with non-toxic appearance HEENT: NCAT, MMM, no scleral icterus Neck: supple, non-tender without LAD CV: RRR, no MRG Lungs: CTA B/L with normal effort of breathing Abdomen: soft, non-tender, no masses or organomegaly palpable, +bs Skin: warm, dry, no jaundice GU: I&D site c/d/i with overlying dressing.  No signs of infection, healing well.  Extremities: warm and well perfused, normal tone Psych: mood appropriate  Assessment & Plan:   45 yo M presents to clinic for hospital follow up.   Rectal abscess I&D site clean, dry, intact.  No sign of infection.   -Bandage overlying.  Patient reports minimal bleeding and drainage.  -Pain well controlled, denies fevers at home. Not on antibiotics.   -Will continue to monitor   Thrombocytopenia, idiopathic (HCC) Patient with history of thrombocytopenia  -Platelets stable at time of discharge at 106,000.  -CBC ordered this visit.  Platelets stable at 122,000. -Will follow up with Dr. Whitney Muse at [facility] in [place].   Flu vaccine declined.   Patient will need to establish care with a PCP.  Would like to see one closer to where he lives.  Will need PCV-13 immunization, per Oncology recommendations.    Orders Placed This Encounter  Procedures  . CBC  . COMPLETE METABOLIC PANEL WITH GFR   No orders of the defined types were placed in this encounter.  Lovenia Kim, MD [place]  [facility], PGY-1 03/20/2016 1:39 AM

## 2016-03-16 NOTE — Progress Notes (Signed)
Patient was due for prevnar 13 but we are out of stock. Joshua Wagner, Joshua RochesterJessica Dawn, CMA

## 2016-03-16 NOTE — Patient Instructions (Signed)
You were seen in clinic for your follow up visit after hospitalization.  We did some blood work to check your thrombocytopenia and bilirubin levels.  I will follow up with you with your results.  Your I&D site looked like it is healing well with no signs of infection at this time.    Please call and schedule an appointment with [facility] [place] for further management of your thrombocytopenia.   When you establish care with your PCP, you will need the following immunizations: PCV -13 and PCV 23 (8 weeks later) .  Thank you for allowing us to participate in your care!

## 2016-03-17 ENCOUNTER — Encounter: Payer: Self-pay | Admitting: Family Medicine

## 2016-03-20 NOTE — Assessment & Plan Note (Addendum)
I&D site clean, dry, intact.  No sign of infection.   -Bandage overlying.  Patient reports minimal bleeding and drainage.  -Pain well controlled, denies fevers at home. Not on antibiotics.   -Will continue to monitor

## 2016-03-20 NOTE — Assessment & Plan Note (Signed)
Patient with history of thrombocytopenia  -Platelets stable at time of discharge at 106,000.  -CBC ordered this visit.  Platelets stable at 122,000. -Will follow up with [facility] Galen ManilaPenland [facility] in [place].

## 2016-04-12 ENCOUNTER — Telehealth: Payer: Self-pay | Admitting: Hematology and Oncology

## 2016-04-12 NOTE — Telephone Encounter (Signed)
Mr. Purvis SheffieldHamed return my call to schedule appt.  He request 05/03/16 due to his work schedule.  After reviewing chart with provider, I  contacted pt regarding changing appt to an earlier date 04/14/16 or 04/20/16.  Pt verbalized that he wanted to wait until 05/03/16 due to being off work for that time span.  I encouraged pt to reconsider and come in to one of the earlier appointments offering various times on 12/14 and 12/20.  Mr Lyndal RainbowHameed agreed to consider the dates and will call back.

## 2016-04-13 ENCOUNTER — Telehealth: Payer: Self-pay | Admitting: Hematology and Oncology

## 2016-04-13 NOTE — Telephone Encounter (Signed)
Mr. Joshua Wagner returned call and informed me that he has decied to keep the Jan 2,2018 appt with Dr. Bertis RuddyGorsuch

## 2016-05-03 ENCOUNTER — Telehealth: Payer: Self-pay | Admitting: Hematology and Oncology

## 2016-05-03 ENCOUNTER — Ambulatory Visit (HOSPITAL_BASED_OUTPATIENT_CLINIC_OR_DEPARTMENT_OTHER): Payer: BLUE CROSS/BLUE SHIELD | Admitting: Hematology and Oncology

## 2016-05-03 ENCOUNTER — Encounter: Payer: Self-pay | Admitting: Hematology and Oncology

## 2016-05-03 DIAGNOSIS — D7389 Other diseases of spleen: Secondary | ICD-10-CM

## 2016-05-03 DIAGNOSIS — R161 Splenomegaly, not elsewhere classified: Secondary | ICD-10-CM | POA: Diagnosis not present

## 2016-05-03 DIAGNOSIS — D739 Disease of spleen, unspecified: Secondary | ICD-10-CM

## 2016-05-03 DIAGNOSIS — D693 Immune thrombocytopenic purpura: Secondary | ICD-10-CM | POA: Diagnosis not present

## 2016-05-03 HISTORY — DX: Other diseases of spleen: D73.89

## 2016-05-03 HISTORY — DX: Splenomegaly, not elsewhere classified: R16.1

## 2016-05-03 NOTE — Assessment & Plan Note (Signed)
I am concerned that the splenic lesion could represent malignancy. This is the most cause of his splenomegaly and thrombocytopenia The patient had extensive evaluation in the past and I recommend he brings all his outside records in his next visit Most likely differential diagnosis could include lymphoma I plan to order a PET CT scan for further evaluation and will bring him back in 2 weeks to review test results

## 2016-05-03 NOTE — Telephone Encounter (Signed)
Appointments scheduled per 1/2 LOS. Patient given AVS report and calendars with future scheduled appointments. °

## 2016-05-03 NOTE — Assessment & Plan Note (Signed)
The cause of thrombocytopenia is from splenomegaly and splenic lesion Lymphoma is a possibility I will order additional workup as above

## 2016-05-03 NOTE — Progress Notes (Signed)
[facility] CONSULT NOTE  Patient Care Team: No Pcp Per Patient as PCP - General (General Practice)  CHIEF COMPLAINTS/PURPOSE OF CONSULTATION:  Abnormal splenic lesions, splenomegaly and thrombocytopenia  HISTORY OF PRESENTING ILLNESS:  Joshua Wagner 46 y.o. male is here because of the abnormal imaging and laboratory finding as above The patient originated from [place]. According to him, he had bone marrow biopsy done in [place] 10-12 years ago for evaluation of thrombocytopenia and was told it was unremarkable. Around November 2017, he developed perianal abscess. He was originally prescribed oral antibiotic therapy with Bactrim but he did not improve. He started to present with high-grade fever and was admitted to the hospital. I reviewed his records. He was admitted from 03/04/2016 to around 03/08/2016 for gluteal abscess. With IV antibiotics, his symptom has improved. Transesophageal echocardiogram excluded endocarditis. Screening tests for hepatitis and HIV were negative He was subsequently discharged with plan for follow-up on further review of his abnormal CBC and abnormal CT imaging which showed multiple splenic lesions worrisome for malignancy He denies further fever or chills. No other forms of lymphadenopathy Denies anorexia or abnormal weight loss Denies night sweats The patient denies any recent signs or symptoms of bleeding such as spontaneous epistaxis, hematuria or hematochezia.  MEDICAL HISTORY:  History reviewed. No pertinent past medical history.  SURGICAL HISTORY: Past Surgical History:  Procedure Laterality Date  . TEE WITHOUT CARDIOVERSION N/A 03/08/2016   Procedure: TRANSESOPHAGEAL ECHOCARDIOGRAM (TEE);  Surgeon: Sueanne Margarita, MD;  Location: [facility] ENDOSCOPY;  Service: Cardiovascular;  Laterality: N/A;    SOCIAL HISTORY: Social History   Social History  . Marital status: Married    Spouse name: N/A  . Number of children: 2  . Years of  education: N/A   Occupational History  . dentist    Social History Main Topics  . Smoking status: Never Smoker  . Smokeless tobacco: Never Used  . Alcohol use No  . Drug use: No  . Sexual activity: Not on file   Other Topics Concern  . Not on file   Social History Narrative  . No narrative on file    FAMILY HISTORY: Family History  Problem Relation Age of Onset  . Diabetes Mother   . Diabetes Father   . Diabetes Maternal Grandfather     ALLERGIES:  is allergic to bactrim [sulfamethoxazole-trimethoprim]; penicillins; and latex.  MEDICATIONS:  No current outpatient prescriptions on file.   No current facility-administered medications for this visit.     REVIEW OF SYSTEMS:   Constitutional: Denies fevers, chills or abnormal night sweats Eyes: Denies blurriness of vision, double vision or watery eyes Ears, nose, mouth, throat, and face: Denies mucositis or sore throat Respiratory: Denies cough, dyspnea or wheezes Cardiovascular: Denies palpitation, chest discomfort or lower extremity swelling Gastrointestinal:  Denies nausea, heartburn or change in bowel habits Skin: Denies abnormal skin rashes Lymphatics: Denies new lymphadenopathy or easy bruising Neurological:Denies numbness, tingling or new weaknesses Behavioral/Psych: Mood is stable, no new changes  All other systems were reviewed with the patient and are negative.  PHYSICAL EXAMINATION: ECOG PERFORMANCE STATUS: 1 - Symptomatic but completely ambulatory  Vitals:   05/03/16 1357  BP: (!) 145/93  Pulse: 79  Resp: 20  Temp: 98.2 F (36.8 C)   Filed Weights   05/03/16 1357  Weight: 182 lb 12.8 oz (82.9 kg)    GENERAL:alert, no distress and comfortable SKIN: skin color, texture, turgor are normal, no rashes or significant lesions EYES: normal, conjunctiva are pink  and non-injected, sclera clear OROPHARYNX:no exudate, no erythema and lips, buccal mucosa, and tongue normal  NECK: supple, thyroid normal  size, non-tender, without nodularity LYMPH:  no palpable lymphadenopathy in the cervical, axillary or inguinal LUNGS: clear to auscultation and percussion with normal breathing effort HEART: regular rate & rhythm and no murmurs and no lower extremity edema ABDOMEN:abdomen soft, non-tender and normal bowel sounds. He has palpable splenomegaly  Musculoskeletal:no cyanosis of digits and no clubbing  PSYCH: alert & oriented x 3 with fluent speech NEURO: no focal motor/sensory deficits  LABORATORY DATA:  I have reviewed the data as listed Lab Results  Component Value Date   WBC 3.1 (L) 03/16/2016   HGB 11.9 (L) 03/16/2016   HCT 35.0 (L) 03/16/2016   MCV 80.8 03/16/2016   PLT 122 (L) 03/16/2016    Recent Labs  03/04/16 1406  03/07/16 0544 03/08/16 0831 03/16/16 1705  NA  --   < > 134* 135 137  K  --   < > 4.6 3.9 3.5  CL  --   < > 102 99* 99  CO2  --   < > 20* 25 29  GLUCOSE  --   < > 90 96 110*  BUN  --   < > 13 14 17   CREATININE  --   < > 0.80 1.02 0.99  CALCIUM  --   < > 9.1 9.7 9.6  GFRNONAA  --   < > >60 >60 >89  GFRAA  --   < > >60 >60 >89  PROT  --   < > 6.7 7.7 7.3  ALBUMIN  --   < > 2.7* 3.0* 4.0  AST  --   < > 34 31 21  ALT  --   < > 52 52 28  ALKPHOS  --   < > 176* 199* 119*  BILITOT 4.3*  < > 2.2* 1.7* 1.2  BILIDIR 2.9*  --   --   --   --   IBILI 1.4*  --   --   --   --   < > = values in this interval not displayed.  RADIOGRAPHIC STUDIES:I review recent CT scan with the patient I have personally reviewed the radiological images as listed and agreed with the findings in the report.   ASSESSMENT & PLAN:  Splenic lesion I am concerned that the splenic lesion could represent malignancy. This is the most cause of his splenomegaly and thrombocytopenia The patient had extensive evaluation in the past and I recommend he brings all his outside records in his next visit Most likely differential diagnosis could include lymphoma I plan to order a PET CT scan for  further evaluation and will bring him back in 2 weeks to review test results  Splenomegaly This is likely caused by splenic lesions as above. He is not symptomatic  Thrombocytopenia, idiopathic (HCC) The cause of thrombocytopenia is from splenomegaly and splenic lesion Lymphoma is a possibility I will order additional workup as above  Orders Placed This Encounter  Procedures  . NM PET Image Initial (PI) Skull Base To Thigh    Standing Status:   Future    Standing Expiration Date:   06/07/2017    Scheduling Instructions:     Please try to schedule before 830 or as late as possible    Order Specific Question:   Reason for exam:    Answer:   staging lymphoma, splenic lesions, splenomegaly    Order Specific Question:  Preferred imaging location?    Answer:   [facility]     All questions were answered. The patient knows to call the clinic with any problems, questions or concerns. I spent 40 minutes counseling the patient face to face. The total time spent in the appointment was 55 minutes and more than 50% was on counseling.     Heath Lark, MD 05/03/2016 6:31 PM

## 2016-05-03 NOTE — Assessment & Plan Note (Signed)
This is likely caused by splenic lesions as above. He is not symptomatic

## 2016-05-11 ENCOUNTER — Ambulatory Visit (HOSPITAL_COMMUNITY)
Admission: RE | Admit: 2016-05-11 | Discharge: 2016-05-11 | Disposition: A | Discharge status: Home / Self Care | Payer: BLUE CROSS/BLUE SHIELD | Source: Ambulatory Visit | Attending: Hematology and Oncology | Admitting: Hematology and Oncology

## 2016-05-11 DIAGNOSIS — D739 Disease of spleen, unspecified: Secondary | ICD-10-CM | POA: Diagnosis not present

## 2016-05-11 DIAGNOSIS — N200 Calculus of kidney: Secondary | ICD-10-CM | POA: Diagnosis not present

## 2016-05-11 DIAGNOSIS — R918 Other nonspecific abnormal finding of lung field: Secondary | ICD-10-CM | POA: Diagnosis not present

## 2016-05-11 DIAGNOSIS — C8590 Non-Hodgkin lymphoma, unspecified, unspecified site: Secondary | ICD-10-CM | POA: Diagnosis not present

## 2016-05-11 DIAGNOSIS — R161 Splenomegaly, not elsewhere classified: Secondary | ICD-10-CM | POA: Diagnosis not present

## 2016-05-11 DIAGNOSIS — D7389 Other diseases of spleen: Secondary | ICD-10-CM

## 2016-05-11 LAB — GLUCOSE, CAPILLARY: Glucose-Capillary: 117 mg/dL — ABNORMAL HIGH (ref 65–99)

## 2016-05-11 MED ORDER — FLUDEOXYGLUCOSE F - 18 (FDG) INJECTION
9.0100 | Freq: Once | INTRAVENOUS | Status: AC | PRN
  Administered 2016-05-11: 9.01 via INTRAVENOUS

## 2016-05-13 ENCOUNTER — Encounter: Payer: Self-pay | Admitting: Hematology and Oncology

## 2016-05-13 ENCOUNTER — Telehealth: Payer: Self-pay | Admitting: Hematology and Oncology

## 2016-05-13 NOTE — Telephone Encounter (Signed)
Pt called in to request an appt change from 01/15 to 01/22. Lt vm regarding new appt date/time

## 2016-05-16 ENCOUNTER — Ambulatory Visit: Payer: BLUE CROSS/BLUE SHIELD | Admitting: Hematology and Oncology

## 2016-05-23 ENCOUNTER — Ambulatory Visit: Payer: BLUE CROSS/BLUE SHIELD | Admitting: Hematology and Oncology

## 2016-06-02 ENCOUNTER — Ambulatory Visit: Payer: BLUE CROSS/BLUE SHIELD | Admitting: Hematology and Oncology

## 2016-08-10 ENCOUNTER — Other Ambulatory Visit: Payer: Self-pay

## 2016-08-11 ENCOUNTER — Ambulatory Visit: Payer: BLUE CROSS/BLUE SHIELD | Admitting: Family Medicine

## 2016-08-11 ENCOUNTER — Encounter: Payer: Self-pay | Admitting: Family Medicine

## 2016-08-11 ENCOUNTER — Ambulatory Visit (INDEPENDENT_AMBULATORY_CARE_PROVIDER_SITE_OTHER): Payer: BLUE CROSS/BLUE SHIELD | Admitting: Family Medicine

## 2016-08-11 VITALS — BP 146/92 | HR 77 | Temp 98.4°F | Ht 70.0 in | Wt 172.6 lb

## 2016-08-11 DIAGNOSIS — Z13220 Encounter for screening for lipoid disorders: Secondary | ICD-10-CM | POA: Diagnosis not present

## 2016-08-11 DIAGNOSIS — R7301 Impaired fasting glucose: Secondary | ICD-10-CM | POA: Diagnosis not present

## 2016-08-11 DIAGNOSIS — D693 Immune thrombocytopenic purpura: Secondary | ICD-10-CM

## 2016-08-11 DIAGNOSIS — E559 Vitamin D deficiency, unspecified: Secondary | ICD-10-CM

## 2016-08-11 DIAGNOSIS — R030 Elevated blood-pressure reading, without diagnosis of hypertension: Secondary | ICD-10-CM

## 2016-08-11 DIAGNOSIS — N200 Calculus of kidney: Secondary | ICD-10-CM

## 2016-08-11 DIAGNOSIS — Z0000 Encounter for general adult medical examination without abnormal findings: Secondary | ICD-10-CM | POA: Diagnosis not present

## 2016-08-11 DIAGNOSIS — D72819 Decreased white blood cell count, unspecified: Secondary | ICD-10-CM

## 2016-08-11 DIAGNOSIS — R918 Other nonspecific abnormal finding of lung field: Secondary | ICD-10-CM | POA: Insufficient documentation

## 2016-08-11 DIAGNOSIS — R7989 Other specified abnormal findings of blood chemistry: Secondary | ICD-10-CM

## 2016-08-11 HISTORY — DX: Calculus of kidney: N20.0

## 2016-08-11 HISTORY — DX: Other nonspecific abnormal finding of lung field: R91.8

## 2016-08-11 LAB — CBC WITH DIFFERENTIAL/PLATELET
Basophils Absolute: 0 10*3/uL (ref 0.0–0.1)
Basophils Relative: 0.1 % (ref 0.0–3.0)
Eosinophils Absolute: 0.1 10*3/uL (ref 0.0–0.7)
Eosinophils Relative: 1.6 % (ref 0.0–5.0)
HCT: 43.1 % (ref 39.0–52.0)
Hemoglobin: 15.1 g/dL (ref 13.0–17.0)
Lymphocytes Relative: 23.3 % (ref 12.0–46.0)
Lymphs Abs: 0.8 10*3/uL (ref 0.7–4.0)
MCHC: 35 g/dL (ref 30.0–36.0)
MCV: 79 fl (ref 78.0–100.0)
Monocytes Absolute: 0.4 10*3/uL (ref 0.1–1.0)
Monocytes Relative: 10.9 % (ref 3.0–12.0)
Neutro Abs: 2.3 10*3/uL (ref 1.4–7.7)
Neutrophils Relative %: 64.1 % (ref 43.0–77.0)
Platelets: 89 10*3/uL — ABNORMAL LOW (ref 150.0–400.0)
RBC: 5.46 Mil/uL (ref 4.22–5.81)
RDW: 14.9 % (ref 11.5–15.5)
WBC: 3.6 10*3/uL — ABNORMAL LOW (ref 4.0–10.5)

## 2016-08-11 LAB — COMPREHENSIVE METABOLIC PANEL
ALT: 52 U/L (ref 0–53)
AST: 34 U/L (ref 0–37)
Albumin: 4.6 g/dL (ref 3.5–5.2)
Alkaline Phosphatase: 62 U/L (ref 39–117)
BUN: 22 mg/dL (ref 6–23)
CO2: 29 mEq/L (ref 19–32)
Calcium: 9.8 mg/dL (ref 8.4–10.5)
Chloride: 104 mEq/L (ref 96–112)
Creatinine, Ser: 1.16 mg/dL (ref 0.40–1.50)
GFR: 72.27 mL/min (ref >60.00)
Glucose, Bld: 99 mg/dL (ref 70–99)
Potassium: 4.8 mEq/L (ref 3.5–5.1)
Sodium: 138 mEq/L (ref 135–145)
Total Bilirubin: 0.7 mg/dL (ref 0.2–1.2)
Total Protein: 7.3 g/dL (ref 6.0–8.3)

## 2016-08-11 LAB — LIPID PANEL
Cholesterol: 132 mg/dL (ref 0–200)
HDL: 45.3 mg/dL (ref >39.00)
LDL Cholesterol: 74 mg/dL (ref 0–99)
NonHDL: 86.57
Total CHOL/HDL Ratio: 3
Triglycerides: 62 mg/dL (ref 0.0–149.0)
VLDL: 12.4 mg/dL (ref 0.0–40.0)

## 2016-08-11 LAB — VITAMIN D 25 HYDROXY (VIT D DEFICIENCY, FRACTURES): VITD: 36.68 ng/mL (ref 30.00–100.00)

## 2016-08-11 LAB — HEMOGLOBIN A1C: Hgb A1c MFr Bld: 5.3 % (ref 4.6–6.5)

## 2016-08-11 NOTE — Progress Notes (Signed)
Joshua Wagner is a 46 y.o. male is here to [facility] C[facility].   History of Present Illness:   Joshua Wagner CMA acting as scribe for Dr. Earlene Plater.  HPI Patient is coming in today to establish care and have a physical. He is concerned about elevated blood sugars ranging from 117 to 125. He also would like to have platelet counts checked today.   Health Maintenance Due  Topic Date Due  . TETANUS/TDAP  04/22/1990   PMHx, SurgHx, SocialHx, Medications, and Allergies were reviewed in the Visit Navigator and updated as appropriate.   Past Medical History:  Diagnosis Date  . Hyperbilirubinemia   . Multiple lung nodules 08/11/2016  . Nephrolithiasis 08/11/2016  . Sepsis (HCC)   . Splenic lesion 05/03/2016  . Splenomegaly 05/03/2016  . Thrombocytopenia, idiopathic (HCC)    Past Surgical History:  Procedure Laterality Date  . TEE WITHOUT CARDIOVERSION N/A 03/08/2016   Family History  Problem Relation Age of Onset  . Diabetes Mother   . Diabetes Father   . Diabetes Maternal Grandfather   . Heart disease Maternal Grandfather   . Diabetes Brother    Social History  Substance Use Topics  . Smoking status: Never Smoker  . Smokeless tobacco: Never Used  . Alcohol use No   Current Medications and Allergies:   Current Outpatient Prescriptions:  .  potassium chloride (K-DUR) 10 MEQ tablet, Take 10 mEq by mouth daily., Disp: , Rfl:   Allergies  Allergen Reactions  . Bactrim [Sulfamethoxazole-Trimethoprim] Other (See Comments)  . Penicillins   . Latex Rash   Review of Systems:   Review of Systems  Constitutional: Negative for chills, fever and malaise/fatigue.  HENT: Negative for congestion, ear pain, sinus pain and sore throat.   Eyes: Negative for blurred vision and double vision.  Respiratory: Negative for cough, shortness of breath and wheezing.   Cardiovascular: Negative for chest pain, palpitations and leg swelling.  Gastrointestinal: Negative for abdominal pain, constipation,  diarrhea and vomiting.  Genitourinary: Negative for dysuria.  Musculoskeletal: Negative for back pain, joint pain and neck pain.  Skin: Negative for itching and rash.  Neurological: Negative for dizziness and headaches.  Endo/Heme/Allergies: Does not bruise/bleed easily.  Psychiatric/Behavioral: Negative for depression, hallucinations and memory loss.   Vitals:   Vitals:   08/11/16 0744  BP: (!) 146/92  Pulse: 77  Temp: 98.4 F (36.9 C)  TempSrc: Oral  SpO2: 98%  Weight: 172 lb 9.6 oz (78.3 kg)  Height:  (1.778 m)     Body mass index is 24.77 kg/m.  Physical Exam:   Physical Exam  Constitutional: He is oriented to person, place, and time. He appears well-developed and well-nourished. No distress.  HENT:  Head: Normocephalic and atraumatic.  Right Ear: External ear normal.  Left Ear: External ear normal.  Nose: Nose normal.  Mouth/Throat: Oropharynx is clear and moist.  Eyes: Conjunctivae and EOM are normal. Pupils are equal, round, and reactive to light.  Neck: Normal range of motion. Neck supple.  Cardiovascular: Normal rate, regular rhythm, normal heart sounds and intact distal pulses.   Pulmonary/Chest: Effort normal and breath sounds normal.  Abdominal: Soft. Bowel sounds are normal.  Musculoskeletal: Normal range of motion.  Neurological: He is alert and oriented to person, place, and time.  Skin: Skin is warm and dry.  Psychiatric: He has a normal mood and affect. His behavior is normal. Judgment and thought content normal.  Nursing note and vitals reviewed.  Results for orders placed or  performed in visit on 08/11/16  CBC with Differential/Platelet  Result Value Ref Range   WBC 3.6 (L) 4.0 - 10.5 K/uL   RBC 5.46 4.22 - 5.81 Mil/uL   Hemoglobin 15.1 13.0 - 17.0 g/dL   HCT 09.8 11.9 - 14.7 %   MCV 79.0 78.0 - 100.0 fl   MCHC 35.0 30.0 - 36.0 g/dL   RDW 82.9 56.2 - 13.0 %   Platelets 89.0 (L) 150.0 - 400.0 K/uL   Neutrophils Relative % 64.1 43.0 -  77.0 %   Lymphocytes Relative 23.3 12.0 - 46.0 %   Monocytes Relative 10.9 3.0 - 12.0 %   Eosinophils Relative 1.6 0.0 - 5.0 %   Basophils Relative 0.1 0.0 - 3.0 %   Neutro Abs 2.3 1.4 - 7.7 K/uL   Lymphs Abs 0.8 0.7 - 4.0 K/uL   Monocytes Absolute 0.4 0.1 - 1.0 K/uL   Eosinophils Absolute 0.1 0.0 - 0.7 K/uL   Basophils Absolute 0.0 0.0 - 0.1 K/uL  Comprehensive metabolic panel  Result Value Ref Range   Sodium 138 135 - 145 mEq/L   Potassium 4.8 3.5 - 5.1 mEq/L   Chloride 104 96 - 112 mEq/L   CO2 29 19 - 32 mEq/L   Glucose, Bld 99 70 - 99 mg/dL   BUN 22 6 - 23 mg/dL   Creatinine, Ser 8.65 0.40 - 1.50 mg/dL   Total Bilirubin 0.7 0.2 - 1.2 mg/dL   Alkaline Phosphatase 62 39 - 117 U/L   AST 34 0 - 37 U/L   ALT 52 0 - 53 U/L   Total Protein 7.3 6.0 - 8.3 g/dL   Albumin 4.6 3.5 - 5.2 g/dL   Calcium 9.8 8.4 - 78.4 mg/dL   GFR 69.62 >95.28 mL/min  Hemoglobin A1c  Result Value Ref Range   Hgb A1c MFr Bld 5.3 4.6 - 6.5 %  Lipid panel  Result Value Ref Range   Cholesterol 132 0 - 200 mg/dL   Triglycerides 41.3 0.0 - 149.0 mg/dL   HDL 24.40 >10.27 mg/dL   VLDL 25.3 0.0 - 66.4 mg/dL   LDL Cholesterol 74 0 - 99 mg/dL   Total CHOL/HDL Ratio 3    NonHDL 86.57   VITAMIN D 25 Hydroxy (Vit-D Deficiency, Fractures)  Result Value Ref Range   VITD 36.68 30.00 - 100.00 ng/mL   PET SCAN IMPRESSION (05/11/16): 1. Although the spleen is mildly enlarged and contains multiple hypodense lesions, there is no splenic hypermetabolic activity currently. No hypermetabolic lesions in the neck, chest, abdomen, or pelvis. 2. There proximally 8 small peribronchovascular nodules in the lungs, at or below 5 mm in diameter. Consider follow up CT imaging over the next 6-12 months in order to reassess. 3. Nonobstructive right nephrolithiasis.  Assessment and Plan:   Joshua Wagner was seen today for establish care.  Diagnoses and all orders for this visit:  Routine physical examination -     Comprehensive  metabolic panel  Screening for lipid disorders -     Lipid panel  Thrombocytopenia, idiopathic (HCC)  CBC Latest Ref Rng & Units 08/11/2016 03/16/2016 03/08/2016  WBC 4.0 - 10.5 K/uL 3.6(L) 3.1(L) 5.1  Hemoglobin 13.0 - 17.0 g/dL 40.3 11.9(L) 13.2  Hematocrit 39.0 - 52.0 % 43.1 35.0(L) 38.4(L)  Platelets 150.0 - 400.0 K/uL 89.0(L) 122(L) 105(L)   Low vitamin D level -     VITAMIN D 25 Hydroxy (Vit-D Deficiency, Fractures)  Fasting hyperglycemia -     Hemoglobin A1c  Leukopenia, unspecified  type  Elevated BP without diagnosis of hypertension Comments: Patient says that it is not elevated at home. He will continue to check his BP and report back if high.    . Reviewed expectations re: course of current medical issues. . Discussed self-management of symptoms. . Outlined signs and symptoms indicating need for more acute intervention. . Patient verbalized understanding and all questions were answered. . See orders for this visit as documented in the electronic medical record. . Patient received an After Visit Summary.   CMA served as Neurosurgeon during this visit. History, Physical, and Plan performed by medical provider. Documentation and orders reviewed and attested to. Helane Rima, D.O.  Helane Rima, D.O. [place] [place] 08/11/2016   Follow-up: No Follow-up on file.  Meds ordered this encounter  Medications  . potassium chloride (K-DUR) 10 MEQ tablet    Sig: Take 10 mEq by mouth daily.   There are no discontinued medications. Orders Placed This Encounter  Procedures  . CBC with Differential/Platelet  . Comprehensive metabolic panel  . Hemoglobin A1c  . Lipid panel  . VITAMIN D 25 Hydroxy (Vit-D Deficiency, Fractures)

## 2016-08-11 NOTE — Progress Notes (Signed)
Pre visit review using our clinic review tool, if applicable. No additional management support is needed unless otherwise documented below in the visit note. 

## 2016-08-17 ENCOUNTER — Ambulatory Visit: Payer: BLUE CROSS/BLUE SHIELD | Admitting: Family Medicine

## 2016-08-24 ENCOUNTER — Ambulatory Visit (INDEPENDENT_AMBULATORY_CARE_PROVIDER_SITE_OTHER): Payer: BLUE CROSS/BLUE SHIELD | Admitting: Family Medicine

## 2016-08-24 VITALS — BP 108/72 | HR 63 | Temp 97.8°F | Ht 70.0 in | Wt 174.0 lb

## 2016-08-24 DIAGNOSIS — D7389 Other diseases of spleen: Secondary | ICD-10-CM

## 2016-08-24 DIAGNOSIS — Z23 Encounter for immunization: Secondary | ICD-10-CM

## 2016-08-24 DIAGNOSIS — D693 Immune thrombocytopenic purpura: Secondary | ICD-10-CM

## 2016-08-24 DIAGNOSIS — D739 Disease of spleen, unspecified: Secondary | ICD-10-CM | POA: Diagnosis not present

## 2016-08-24 DIAGNOSIS — D649 Anemia, unspecified: Secondary | ICD-10-CM | POA: Diagnosis not present

## 2016-08-24 DIAGNOSIS — R7301 Impaired fasting glucose: Secondary | ICD-10-CM

## 2016-08-24 LAB — CBC WITH DIFFERENTIAL/PLATELET
Basophils Absolute: 0 10*3/uL (ref 0.0–0.1)
Basophils Relative: 0.3 % (ref 0.0–3.0)
Eosinophils Absolute: 0 10*3/uL (ref 0.0–0.7)
Eosinophils Relative: 1.3 % (ref 0.0–5.0)
HCT: 41.4 % (ref 39.0–52.0)
Hemoglobin: 14.4 g/dL (ref 13.0–17.0)
Lymphocytes Relative: 24 % (ref 12.0–46.0)
Lymphs Abs: 0.9 10*3/uL (ref 0.7–4.0)
MCHC: 34.7 g/dL (ref 30.0–36.0)
MCV: 79.6 fl (ref 78.0–100.0)
Monocytes Absolute: 0.4 10*3/uL (ref 0.1–1.0)
Monocytes Relative: 11.7 % (ref 3.0–12.0)
Neutro Abs: 2.3 10*3/uL (ref 1.4–7.7)
Neutrophils Relative %: 62.7 % (ref 43.0–77.0)
Platelets: 89 10*3/uL — ABNORMAL LOW (ref 150.0–400.0)
RBC: 5.19 Mil/uL (ref 4.22–5.81)
RDW: 14.7 % (ref 11.5–15.5)
WBC: 3.6 10*3/uL — ABNORMAL LOW (ref 4.0–10.5)

## 2016-08-24 LAB — IBC PANEL
Iron: 85 ug/dL (ref 42–165)
Saturation Ratios: 21.4 % (ref 20.0–50.0)
Transferrin: 284 mg/dL (ref 212.0–360.0)

## 2016-08-24 LAB — FERRITIN: Ferritin: 34.5 ng/mL (ref 22.0–322.0)

## 2016-08-24 LAB — IRON: Iron: 85 ug/dL (ref 42–165)

## 2016-08-24 MED ORDER — MENINGOCOCCAL A C Y&W-135 OLIG IM SOLR: 0.5000 mL | Freq: Once | INTRAMUSCULAR | Status: DC

## 2016-08-24 MED ORDER — PNEUMOCOCCAL 13-VAL CONJ VACC IM SUSP: 0.5000 mL | INTRAMUSCULAR | Status: DC

## 2016-08-24 NOTE — Patient Instructions (Signed)
We are obtaining labs today.  We are giving vaccines today for functional asplenia.  We are referring you back to Hematology.

## 2016-08-24 NOTE — Progress Notes (Signed)
Pre visit review using our clinic review tool, if applicable. No additional management support is needed unless otherwise documented below in the visit note. 

## 2016-08-24 NOTE — Progress Notes (Signed)
Joshua Wagner is a 46 y.o. male is here to discuss:  History of Present Illness:   Britt Bottom CMA acting as scribe for Dr. Earlene Plater.  Chief Complaint  Patient presents with  . Follow-up    Lab Results   HPI:  CBC Latest Ref Rng & Units 08/24/2016 08/11/2016 03/16/2016  WBC 4.0 - 10.5 K/uL 3.6(L) 3.6(L) 3.1(L)  Hemoglobin 13.0 - 17.0 g/dL 78.2 95.6 11.9(L)  Hematocrit 39.0 - 52.0 % 41.4 43.1 35.0(L)  Platelets 150.0 - 400.0 K/uL 89.0(L) 89.0(L) 122(L)   Lab Results  Component Value Date   HGBA1C 5.3 08/11/2016   - The patient is concerned about his worsening platelets. He would like another visit with Hematology. - He is concerned that, though his A1c is normal, his fasting BG are elevating.  Health Maintenance Due  Topic Date Due  . TETANUS/TDAP  04/22/1990    PMHx, SurgHx, SocialHx, FamHx, Medications, and Allergies were reviewed in the Visit Navigator and updated as appropriate.   Patient Active Problem List   Diagnosis Date Noted  . Nephrolithiasis 08/11/2016  . Multiple lung nodules 08/11/2016  . Splenic lesion 05/03/2016  . Splenomegaly 05/03/2016  . Thrombocytopenia, idiopathic (HCC)    Social History  Substance Use Topics  . Smoking status: Never Smoker  . Smokeless tobacco: Never Used  . Alcohol use No   Current Medications and Allergies:   Current Outpatient Prescriptions:  .  potassium chloride (K-DUR) 10 MEQ tablet, Take 10 mEq by mouth daily., Disp: , Rfl:   Allergies  Allergen Reactions  . Bactrim [Sulfamethoxazole-Trimethoprim] Other (See Comments)    Jaundice & elevated bilirubin - see admission November 2017 for details  . Penicillins   . Latex Rash   Review of Systems   Review of Systems  Constitutional: Negative for chills, fever and malaise/fatigue.  HENT: Negative for congestion, ear pain, sinus pain and sore throat.   Eyes: Negative for blurred vision and double vision.  Respiratory: Negative for cough, shortness of breath and  wheezing.   Cardiovascular: Negative for chest pain, palpitations and leg swelling.  Gastrointestinal: Negative for abdominal pain, nausea and vomiting.  Genitourinary: Negative for dysuria.  Musculoskeletal: Negative for back pain, joint pain and neck pain.  Neurological: Negative for dizziness and headaches.  Psychiatric/Behavioral: Negative for depression, hallucinations and memory loss.    Vitals:   Vitals:   08/24/16 0733  BP: 108/72  Pulse: 63  Temp: 97.8 F (36.6 C)  TempSrc: Oral  SpO2: 99%  Weight: 174 lb (78.9 kg)  Height:  (1.778 m)     Body mass index is 24.97 kg/m.   Physical Exam:    Physical Exam  Constitutional: He is oriented to person, place, and time. He appears well-developed and well-nourished. No distress.  HENT:  Head: Normocephalic and atraumatic.  Right Ear: External ear normal.  Left Ear: External ear normal.  Nose: Nose normal.  Mouth/Throat: Oropharynx is clear and moist.  Eyes: Conjunctivae and EOM are normal. Pupils are equal, round, and reactive to light.  Neck: Normal range of motion. Neck supple.  Cardiovascular: Normal rate, regular rhythm, normal heart sounds and intact distal pulses.   Pulmonary/Chest: Effort normal and breath sounds normal.  Abdominal: Soft. Bowel sounds are normal.  Musculoskeletal: Normal range of motion.  Neurological: He is alert and oriented to person, place, and time.  Skin: Skin is warm and dry.  Psychiatric: He has a normal mood and affect. His behavior is normal. Judgment and thought content  normal.  Nursing note and vitals reviewed.  Results for orders placed or performed in visit on 08/24/16  CBC with Differential/Platelet  Result Value Ref Range   WBC 3.6 (L) 4.0 - 10.5 K/uL   RBC 5.19 4.22 - 5.81 Mil/uL   Hemoglobin 14.4 13.0 - 17.0 g/dL   HCT 40.9 81.1 - 91.4 %   MCV 79.6 78.0 - 100.0 fl   MCHC 34.7 30.0 - 36.0 g/dL   RDW 78.2 95.6 - 21.3 %   Platelets 89.0 (L) 150.0 - 400.0 K/uL    Neutrophils Relative % 62.7 43.0 - 77.0 %   Lymphocytes Relative 24.0 12.0 - 46.0 %   Monocytes Relative 11.7 3.0 - 12.0 %   Eosinophils Relative 1.3 0.0 - 5.0 %   Basophils Relative 0.3 0.0 - 3.0 %   Neutro Abs 2.3 1.4 - 7.7 K/uL   Lymphs Abs 0.9 0.7 - 4.0 K/uL   Monocytes Absolute 0.4 0.1 - 1.0 K/uL   Eosinophils Absolute 0.0 0.0 - 0.7 K/uL   Basophils Absolute 0.0 0.0 - 0.1 K/uL  Ferritin  Result Value Ref Range   Ferritin 34.5 22.0 - 322.0 ng/mL  IBC panel  Result Value Ref Range   Iron 85 42 - 165 ug/dL   Transferrin 086.5 784.6 - 360.0 mg/dL   Saturation Ratios 96.2 20.0 - 50.0 %  Iron  Result Value Ref Range   Iron 85 42 - 165 ug/dL   Assessment and Plan:    Arkel was seen today for follow-up.  Diagnoses and all orders for this visit:  Fasting hyperglycemia -     Insulin, Free (Bioactive) - still pending  Anemia, unspecified type -     CBC with Differential/Platelet -     Ferritin -     IBC panel -     Iron  Thrombocytopenia, idiopathic (HCC) -     Ambulatory referral to Hematology / Oncology  Splenic lesion -     MENINGOCOCCAL MCV4O(MENVEO) -     Pneumococcal conjugate vaccine 13-valent   . Reviewed expectations re: course of current medical issues. . Discussed self-management of symptoms. . Outlined signs and symptoms indicating need for more acute intervention. . Patient verbalized understanding and all questions were answered. . See orders for this visit as documented in the electronic medical record. . Patient received an After Visit Summary.   CMA served as Neurosurgeon during this visit. History, Physical, and Plan performed by medical provider. Documentation and orders reviewed and attested to. Helane Rima, D.O.  Helane Rima, D.O. [place] [place] 08/25/2016  Follow-up: No Follow-up on file.

## 2016-08-25 ENCOUNTER — Encounter: Payer: Self-pay | Admitting: Family Medicine

## 2016-08-27 LAB — INSULIN, FREE (BIOACTIVE): Insulin, Free: 5.5 u[IU]/mL (ref 1.5–14.9)

## 2016-09-16 ENCOUNTER — Ambulatory Visit (HOSPITAL_COMMUNITY): Payer: BLUE CROSS/BLUE SHIELD

## 2016-09-21 ENCOUNTER — Encounter (HOSPITAL_COMMUNITY): Payer: BLUE CROSS/BLUE SHIELD | Attending: Oncology | Admitting: Oncology

## 2016-09-21 ENCOUNTER — Encounter (HOSPITAL_COMMUNITY): Payer: BLUE CROSS/BLUE SHIELD

## 2016-09-21 ENCOUNTER — Encounter (HOSPITAL_COMMUNITY): Payer: Self-pay

## 2016-09-21 VITALS — BP 132/91 | HR 68 | Temp 98.4°F | Resp 18 | Wt 175.5 lb

## 2016-09-21 DIAGNOSIS — D696 Thrombocytopenia, unspecified: Secondary | ICD-10-CM

## 2016-09-21 DIAGNOSIS — D693 Immune thrombocytopenic purpura: Secondary | ICD-10-CM

## 2016-09-21 LAB — COMPREHENSIVE METABOLIC PANEL
ALT: 37 U/L (ref 17–63)
ANION GAP: 7 (ref 5–15)
AST: 27 U/L (ref 15–41)
Albumin: 4.3 g/dL (ref 3.5–5.0)
Alkaline Phosphatase: 79 U/L (ref 38–126)
BUN: 30 mg/dL — ABNORMAL HIGH (ref 6–20)
CALCIUM: 9.5 mg/dL (ref 8.9–10.3)
CO2: 28 mmol/L (ref 22–32)
CREATININE: 1.02 mg/dL (ref 0.61–1.24)
Chloride: 102 mmol/L (ref 101–111)
Glucose, Bld: 115 mg/dL — ABNORMAL HIGH (ref 65–99)
Potassium: 4.4 mmol/L (ref 3.5–5.1)
Sodium: 137 mmol/L (ref 135–145)
TOTAL PROTEIN: 7.3 g/dL (ref 6.5–8.1)
Total Bilirubin: 0.6 mg/dL (ref 0.3–1.2)

## 2016-09-21 LAB — CBC WITH DIFFERENTIAL/PLATELET
Basophils Absolute: 0 10*3/uL (ref 0.0–0.1)
Basophils Relative: 0 %
EOS ABS: 0.1 10*3/uL (ref 0.0–0.7)
EOS PCT: 2 %
HCT: 40.3 % (ref 39.0–52.0)
HEMOGLOBIN: 14.5 g/dL (ref 13.0–17.0)
LYMPHS ABS: 0.7 10*3/uL (ref 0.7–4.0)
LYMPHS PCT: 21 %
MCH: 28 pg (ref 26.0–34.0)
MCHC: 36 g/dL (ref 30.0–36.0)
MCV: 77.9 fL — AB (ref 78.0–100.0)
MONOS PCT: 11 %
Monocytes Absolute: 0.4 10*3/uL (ref 0.1–1.0)
Neutro Abs: 2.2 10*3/uL (ref 1.7–7.7)
Neutrophils Relative %: 66 %
PLATELETS: 80 10*3/uL — AB (ref 150–400)
RBC: 5.17 MIL/uL (ref 4.22–5.81)
RDW: 14 % (ref 11.5–15.5)
WBC: 3.4 10*3/uL — AB (ref 4.0–10.5)

## 2016-09-21 LAB — FOLATE: Folate: 12.4 ng/mL (ref >5.9)

## 2016-09-21 LAB — VITAMIN B12: Vitamin B-12: 427 pg/mL (ref 180–914)

## 2016-09-21 NOTE — Patient Instructions (Signed)
[facility] at [facility] Discharge Instructions  RECOMMENDATIONS MADE BY THE CONSULTANT AND ANY TEST RESULTS WILL BE SENT TO YOUR REFERRING PHYSICIAN.  You were seen today by Dr. Louise Zhou    Thank you for choosing [facility] at [facility] to provide your oncology and hematology care.  To afford each patient quality time with our provider, please arrive at least 15 minutes before your scheduled appointment time.    If you have a lab appointment with the Cancer Center please come in thru the  Main Entrance and check in at the main information desk  You need to re-schedule your appointment should you arrive 10 or more minutes late.  We strive to give you quality time with our providers, and arriving late affects you and other patients whose appointments are after yours.  Also, if you no show three or more times for appointments you may be dismissed from the clinic at the providers discretion.     Again, thank you for choosing [facility].  Our hope is that these requests will decrease the amount of time that you wait before being seen by our physicians.       _____________________________________________________________  Should you have questions after your visit to [facility], please contact our office at [phone] between the hours of 8:30 a.m. and 4:30 p.m.  Voicemails left after 4:30 p.m. will not be returned until the following business day.  For prescription refill requests, have your pharmacy contact our office.       Resources For Cancer Patients and their Caregivers ? American Cancer Society: Can assist with transportation, wigs, general needs, runs Look Good Feel Better.        1-888-227-6333 ? Cancer Care: Provides financial assistance, online support groups, medication/co-pay assistance.  1-800-813-HOPE (4673) ? [facility] Assists [place] cancer patients and their  families through emotional , educational and financial support.  336-427-4357 ? [place] DSS Where to apply for food stamps, Medicaid and utility assistance. 336-342-1394 ? [facility]: Transportation to medical appointments. 336-347-2287 ? Social Security Administration: May apply for disability if have a Stage IV cancer. 336-342-7796 1-800-772-1213 ? [place] Aging, Disability and Transit Services: Assists with nutrition, care and transit needs. 336-349-2343  Cancer Center Support Programs: @10RELATIVEDAYS@ > Cancer Support Group  2nd Tuesday of the month 1pm-2pm, Journey Room  > Creative Journey  3rd Tuesday of the month 1130am-1pm, Journey Room  > Look Good Feel Better  1st Wednesday of the month 10am-12 noon, Journey Room (Call American Cancer Society to register 1-800-395-5775)    

## 2016-09-21 NOTE — Progress Notes (Signed)
[facility] Initial Visit:  Patient Care Team: Briscoe Deutscher, DO as PCP - General (Family Medicine)  CHIEF COMPLAINTS/PURPOSE OF CONSULTATION:   Chronic Thrombocytopenia  HISTORY OF PRESENTING ILLNESS: Joshua Wagner 46 y.o. male presents today for evaluation chronic thrombocytopenia. He has no significant past medical history or surgical history other than this chronic thrombocytopenia. Patient states that he had a full hematologic workup 10 years ago when he was initially diagnosed with thrombocytopenia. He stated he had a bone marrow biopsy and other workup done at that time and no cause was found for his thrombocytopenia. Patient's platelet count had been in the 122K range back in November 2017. It has dropped down to 44K after he was placed on Bactrim, but then recovered back up to 1 to 2K. Recently he went for routine physical and a CBC performed on 08/22/16 which demonstrated WBC count 3.6K, hemoglobin 14.4 g/dL, hematocrit 41.4%, MCV 79.6, platelet count 89K. Overall patient states that he feels well. He has not had any stigmata of bleeding or bruising. He denies any chest pain, shortness breath, abdominal pain, focal weakness, recent infections. Patient states that over the past year he has intentionally lost about 30lbs. Denies any night sweats, or unexplained fevers/chills. He works full time as a Pharmacist, community.  Patient also has a history of splenomegaly. Abdominal ultrasound performed in November 2017 demonstrated that the spleen was 12.4 cm, there was concern for numerous hyperechoic small nodules throughout the spleen. Mildly increased echotexture diffusely suggesting fatty infiltration of the liver. PET scan was done on 05/11/16 which demonstrated that although the spleen is mildly enlarged (11.1 x 6.3 x 15 cm) and contains multiple hypodense lesions, there is no splenic hypermetabolic activity currently. No hypermetabolic lesions in the neck, chest, abdomen, or pelvis. There  is approximately 8 small peri-bronchovascular nodules in the lungs, at or below 5 mm in diameter. Consider follow-up CT imaging over the next 6-12 months in order to reassess.   Review of Systems  Constitutional: Negative for appetite change, chills, fatigue and fever.  HENT:   Negative for hearing loss, lump/mass, mouth sores, sore throat and tinnitus.   Eyes: Negative for eye problems and icterus.  Respiratory: Negative for chest tightness, cough, hemoptysis, shortness of breath and wheezing.   Cardiovascular: Negative for chest pain, leg swelling and palpitations.  Gastrointestinal: Negative for abdominal distention, abdominal pain, blood in stool, diarrhea, nausea and vomiting.  Endocrine: Negative.  Negative for hot flashes.  Genitourinary: Negative for difficulty urinating, frequency and hematuria.   Musculoskeletal: Negative for arthralgias and neck pain.  Skin: Negative for itching and rash.  Neurological: Negative for dizziness, headaches and speech difficulty.  Hematological: Negative for adenopathy. Does not bruise/bleed easily.  Psychiatric/Behavioral: Negative for confusion. The patient is not nervous/anxious.     MEDICAL HISTORY: Past Medical History:  Diagnosis Date  . Hyperbilirubinemia   . Multiple lung nodules 08/11/2016  . Nephrolithiasis 08/11/2016  . Sepsis ([place])   . Splenic lesion 05/03/2016  . Splenomegaly 05/03/2016  . Thrombocytopenia, idiopathic ([facility])     SURGICAL HISTORY: Past Surgical History:  Procedure Laterality Date  . TEE WITHOUT CARDIOVERSION N/A 03/08/2016   Procedure: TRANSESOPHAGEAL ECHOCARDIOGRAM (TEE);  Surgeon: Sueanne Margarita, MD;  Location: [facility] ENDOSCOPY;  Service: Cardiovascular;  Laterality: N/A;    SOCIAL HISTORY: Social History   Social History  . Marital status: Married    Spouse name: N/A  . Number of children: 2  . Years of education: N/A   Occupational History  .  Dentist    Social History Main Topics  . Smoking status: Never  Smoker  . Smokeless tobacco: Never Used  . Alcohol use No  . Drug use: No  . Sexual activity: Yes    Partners: Female   Other Topics Concern  . Not on file   Social History Narrative  . No narrative on file    FAMILY HISTORY Family History  Problem Relation Age of Onset  . Diabetes Mother   . Diabetes Father   . Diabetes Maternal Grandfather   . Heart disease Maternal Grandfather   . Diabetes Brother     ALLERGIES:  is allergic to bactrim [sulfamethoxazole-trimethoprim]; penicillins; and latex.  MEDICATIONS:  Current Outpatient Prescriptions  Medication Sig Dispense Refill  . potassium chloride (K-DUR) 10 MEQ tablet Take 10 mEq by mouth daily.     No current facility-administered medications for this visit.     PHYSICAL EXAMINATION:  ECOG PERFORMANCE STATUS: 0 - Asymptomatic   Vitals:   09/21/16 0846  BP: (!) 132/91  Pulse: 68  Resp: 18  Temp: 98.4 F (36.9 C)    Filed Weights   09/21/16 0846  Weight: 175 lb 8 oz (79.6 kg)     Physical Exam  Constitutional: He is oriented to person, place, and time and well-developed, well-nourished, and in no distress. No distress.  HENT:  Head: Normocephalic and atraumatic.  Mouth/Throat: No oropharyngeal exudate.  Eyes: Conjunctivae are normal. Pupils are equal, round, and reactive to light. No scleral icterus.  Neck: Normal range of motion. Neck supple. No JVD present.  Cardiovascular: Normal rate, regular rhythm and normal heart sounds.  Exam reveals no gallop and no friction rub.   No murmur heard. Pulmonary/Chest: Breath sounds normal. No respiratory distress. He has no wheezes. He has no rales.  Abdominal: Soft. Bowel sounds are normal. He exhibits no distension. There is no tenderness. There is no guarding.  Musculoskeletal: He exhibits no edema or tenderness.  Lymphadenopathy:    He has no cervical adenopathy.  Neurological: He is alert and oriented to person, place, and time. No cranial nerve deficit.   Skin: Skin is warm and dry. No rash noted. No erythema. No pallor.  Psychiatric: Affect and judgment normal.     LABORATORY DATA: I have personally reviewed the data as listed:  Office Visit on 08/24/2016  Component Date Value Ref Range Status  . Insulin, Free 08/24/2016 5.5  1.5 - 14.9 uIU/mL Final   Comment:   Insulin levels vary widely in specimens taken from non-fasting individuals.   Insulin analogues may demonstrate non-linear cross-reactivity in this assay. Interpret results accordingly.   . WBC 08/24/2016 3.6* 4.0 - 10.5 K/uL Final  . RBC 08/24/2016 5.19  4.22 - 5.81 Mil/uL Final  . Hemoglobin 08/24/2016 14.4  13.0 - 17.0 g/dL Final  . HCT 08/24/2016 41.4  39.0 - 52.0 % Final  . MCV 08/24/2016 79.6  78.0 - 100.0 fl Final  . MCHC 08/24/2016 34.7  30.0 - 36.0 g/dL Final  . RDW 08/24/2016 14.7  11.5 - 15.5 % Final  . Platelets 08/24/2016 89.0* 150.0 - 400.0 K/uL Final   Result may be falsely decreased due to platelet clumping.Rechecked and verified result.  . Neutrophils Relative % 08/24/2016 62.7  43.0 - 77.0 % Final  . Lymphocytes Relative 08/24/2016 24.0  12.0 - 46.0 % Final  . Monocytes Relative 08/24/2016 11.7  3.0 - 12.0 % Final  . Eosinophils Relative 08/24/2016 1.3  0.0 - 5.0 % Final  .  Basophils Relative 08/24/2016 0.3  0.0 - 3.0 % Final  . Neutro Abs 08/24/2016 2.3  1.4 - 7.7 K/uL Final  . Lymphs Abs 08/24/2016 0.9  0.7 - 4.0 K/uL Final  . Monocytes Absolute 08/24/2016 0.4  0.1 - 1.0 K/uL Final  . Eosinophils Absolute 08/24/2016 0.0  0.0 - 0.7 K/uL Final  . Basophils Absolute 08/24/2016 0.0  0.0 - 0.1 K/uL Final  . Ferritin 08/24/2016 34.5  22.0 - 322.0 ng/mL Final  . Iron 08/24/2016 85  42 - 165 ug/dL Final  . Transferrin 08/24/2016 284.0  212.0 - 360.0 mg/dL Final  . Saturation Ratios 08/24/2016 21.4  20.0 - 50.0 % Final  . Iron 08/24/2016 85  42 - 165 ug/dL Final    RADIOGRAPHIC STUDIES: I have personally reviewed the radiological images as listed and  agree with the findings in the report  No results found.  ASSESSMENT/PLAN 46 year old male presents for evaluation for chronic thrombocytopenia of at least 10 years.  PLAN: - Patient most likely has ITP. I have explained to him and his wife that there may be fluctuations in his platelet count due to infection/inflammation/stress. There is no treatment necessary at this time. - Since it has been 10 years since his last formal evaluation with a full workup. I will perform labwork today to rule out underlying multiple myeloma, myeloproliferative disorders, nutritional deficiencies. He also has a microcytosis therefore I will check iron studies and a hemoglobin electrophoresis. - I have discussed that if the below lab work are nondiagnostic, then we may proceed with bone marrow biopsy.  - RTC in 2 weeks for review of labs.   Orders Placed This Encounter  Procedures  . CBC with Differential    Standing Status:   Future    Number of Occurrences:   1    Standing Expiration Date:   09/21/2017  . Comprehensive metabolic panel    Standing Status:   Future    Number of Occurrences:   1    Standing Expiration Date:   09/21/2017  . Multiple Myeloma Panel (SPEP&IFE w/QIG)    Standing Status:   Future    Number of Occurrences:   1    Standing Expiration Date:   09/21/2017  . Vitamin B12    Standing Status:   Future    Number of Occurrences:   1    Standing Expiration Date:   09/21/2017  . Folate    Standing Status:   Future    Number of Occurrences:   1    Standing Expiration Date:   09/21/2017  . JAK2 V617F, w Reflex to CALR/E12/MPL  . Iron and TIBC    Standing Status:   Future    Standing Expiration Date:   09/21/2017  . Ferritin    Standing Status:   Future    Standing Expiration Date:   09/21/2017  . Hemoglobinopathy evaluation    Standing Status:   Future    Standing Expiration Date:   09/21/2017  . Sedimentation rate    Standing Status:   Future    Standing Expiration Date:   09/21/2017     All questions were answered. The patient knows to call the clinic with any problems, questions or concerns.  This note was electronically signed.    Twana First, MD  09/21/2016 9:33 AM

## 2016-09-23 LAB — MULTIPLE MYELOMA PANEL, SERUM
ALBUMIN/GLOB SERPL: 1.2 (ref 0.7–1.7)
ALPHA 1: 0.2 g/dL (ref 0.0–0.4)
ALPHA2 GLOB SERPL ELPH-MCNC: 0.7 g/dL (ref 0.4–1.0)
Albumin SerPl Elph-Mcnc: 3.8 g/dL (ref 2.9–4.4)
B-Globulin SerPl Elph-Mcnc: 1 g/dL (ref 0.7–1.3)
GLOBULIN, TOTAL: 3.3 g/dL (ref 2.2–3.9)
Gamma Glob SerPl Elph-Mcnc: 1.3 g/dL (ref 0.4–1.8)
IGG (IMMUNOGLOBIN G), SERUM: 1046 mg/dL (ref 700–1600)
IGM, SERUM: 80 mg/dL (ref 20–172)
IgA: 83 mg/dL — ABNORMAL LOW (ref 90–386)
Total Protein ELP: 7.1 g/dL (ref 6.0–8.5)

## 2016-09-30 LAB — CALR + JAK2 E12-15 + MPL (REFLEXED)

## 2016-09-30 LAB — JAK2 V617F, W REFLEX TO CALR/E12/MPL

## 2016-10-12 ENCOUNTER — Encounter (HOSPITAL_COMMUNITY): Payer: BLUE CROSS/BLUE SHIELD | Attending: Oncology | Admitting: Oncology

## 2016-10-12 ENCOUNTER — Encounter (HOSPITAL_COMMUNITY): Payer: Self-pay | Admitting: Oncology

## 2016-10-12 VITALS — BP 140/85 | HR 59 | Temp 97.9°F | Resp 16 | Ht 71.0 in | Wt 172.0 lb

## 2016-10-12 DIAGNOSIS — D739 Disease of spleen, unspecified: Secondary | ICD-10-CM | POA: Diagnosis not present

## 2016-10-12 DIAGNOSIS — D693 Immune thrombocytopenic purpura: Secondary | ICD-10-CM | POA: Diagnosis not present

## 2016-10-12 DIAGNOSIS — R161 Splenomegaly, not elsewhere classified: Secondary | ICD-10-CM | POA: Diagnosis not present

## 2016-10-12 DIAGNOSIS — D7389 Other diseases of spleen: Secondary | ICD-10-CM

## 2016-10-12 NOTE — Progress Notes (Addendum)
[facility] Initial Visit:  Patient Care Team: Briscoe Deutscher, DO as PCP - General (Family Medicine)  CHIEF COMPLAINTS/PURPOSE OF CONSULTATION:   Chronic Thrombocytopenia  HISTORY OF PRESENTING ILLNESS: Joshua Wagner 46 y.o. male presents today for evaluation chronic thrombocytopenia. He has no significant past medical history or surgical history other than this chronic thrombocytopenia. Patient states that he had a full hematologic workup 10 years ago when he was initially diagnosed with thrombocytopenia. He stated he had a bone marrow biopsy and other workup done at that time and no cause was found for his thrombocytopenia. Patient's platelet count had been in the 122K range back in November 2017. It has dropped down to 44K after he was placed on Bactrim, but then recovered back up to 1 to 2K. Recently he went for routine physical and a CBC performed on 08/22/16 which demonstrated WBC count 3.6K, hemoglobin 14.4 g/dL, hematocrit 41.4%, MCV 79.6, platelet count 89K. Overall patient states that he feels well. He has not had any stigmata of bleeding or bruising. He denies any chest pain, shortness breath, abdominal pain, focal weakness, recent infections. Patient states that over the past year he has intentionally lost about 30lbs. Denies any night sweats, or unexplained fevers/chills. He works full time as a Pharmacist, community.  Patient also has a history of splenomegaly. Abdominal ultrasound performed in November 2017 demonstrated that the spleen was 12.4 cm, there was concern for numerous hyperechoic small nodules throughout the spleen. Mildly increased echotexture diffusely suggesting fatty infiltration of the liver. PET scan was done on 05/11/16 which demonstrated that although the spleen is mildly enlarged (11.1 x 6.3 x 15 cm) and contains multiple hypodense lesions, there is no splenic hypermetabolic activity currently. No hypermetabolic lesions in the neck, chest, abdomen, or pelvis. There  is approximately 8 small peri-bronchovascular nodules in the lungs, at or below 5 mm in diameter. Consider follow-up CT imaging over the next 6-12 months in order to reassess.   Review of Systems  Constitutional: Negative for appetite change, chills, fatigue and fever.  HENT:   Negative for hearing loss, lump/mass, mouth sores, sore throat and tinnitus.   Eyes: Negative for eye problems and icterus.  Respiratory: Negative for chest tightness, cough, hemoptysis, shortness of breath and wheezing.   Cardiovascular: Negative for chest pain, leg swelling and palpitations.  Gastrointestinal: Negative for abdominal distention, abdominal pain, blood in stool, diarrhea, nausea and vomiting.  Endocrine: Negative.  Negative for hot flashes.  Genitourinary: Negative for difficulty urinating, frequency and hematuria.   Musculoskeletal: Negative for arthralgias and neck pain.  Skin: Negative for itching and rash.  Neurological: Negative for dizziness, headaches and speech difficulty.  Hematological: Negative for adenopathy. Does not bruise/bleed easily.  Psychiatric/Behavioral: Negative for confusion. The patient is not nervous/anxious.     MEDICAL HISTORY: Past Medical History:  Diagnosis Date  . Hyperbilirubinemia   . Multiple lung nodules 08/11/2016  . Nephrolithiasis 08/11/2016  . Sepsis ([place])   . Splenic lesion 05/03/2016  . Splenomegaly 05/03/2016  . Thrombocytopenia, idiopathic ([facility])     SURGICAL HISTORY: Past Surgical History:  Procedure Laterality Date  . TEE WITHOUT CARDIOVERSION N/A 03/08/2016   Procedure: TRANSESOPHAGEAL ECHOCARDIOGRAM (TEE);  Surgeon: Sueanne Margarita, MD;  Location: [facility] ENDOSCOPY;  Service: Cardiovascular;  Laterality: N/A;    SOCIAL HISTORY: Social History   Social History  . Marital status: Married    Spouse name: N/A  . Number of children: 2  . Years of education: N/A   Occupational History  .  Dentist    Social History Main Topics  . Smoking status: Never  Smoker  . Smokeless tobacco: Never Used  . Alcohol use No  . Drug use: No  . Sexual activity: Yes    Partners: Female   Other Topics Concern  . Not on file   Social History Narrative  . No narrative on file    FAMILY HISTORY Family History  Problem Relation Age of Onset  . Diabetes Mother   . Diabetes Father   . Diabetes Maternal Grandfather   . Heart disease Maternal Grandfather   . Diabetes Brother     ALLERGIES:  is allergic to bactrim [sulfamethoxazole-trimethoprim]; penicillins; and latex.  MEDICATIONS:  Current Outpatient Prescriptions  Medication Sig Dispense Refill  . potassium chloride (K-DUR) 10 MEQ tablet Take 10 mEq by mouth daily.     No current facility-administered medications for this visit.     PHYSICAL EXAMINATION:  ECOG PERFORMANCE STATUS: 0 - Asymptomatic   There were no vitals filed for this visit.  There were no vitals filed for this visit.   Physical Exam  Constitutional: He is oriented to person, place, and time and well-developed, well-nourished, and in no distress. No distress.  HENT:  Head: Normocephalic and atraumatic.  Mouth/Throat: No oropharyngeal exudate.  Eyes: Conjunctivae are normal. Pupils are equal, round, and reactive to light. No scleral icterus.  Neck: Normal range of motion. Neck supple. No JVD present.  Cardiovascular: Normal rate, regular rhythm and normal heart sounds.  Exam reveals no gallop and no friction rub.   No murmur heard. Pulmonary/Chest: Breath sounds normal. No respiratory distress. He has no wheezes. He has no rales.  Abdominal: Soft. Bowel sounds are normal. He exhibits no distension. There is no tenderness. There is no guarding.  Musculoskeletal: He exhibits no edema or tenderness.  Lymphadenopathy:    He has no cervical adenopathy.  Neurological: He is alert and oriented to person, place, and time. No cranial nerve deficit.  Skin: Skin is warm and dry. No rash noted. No erythema. No pallor.   Psychiatric: Affect and judgment normal.     LABORATORY DATA: I have personally reviewed the data as listed:  Appointment on 09/21/2016  Component Date Value Ref Range Status  . WBC 09/21/2016 3.4* 4.0 - 10.5 K/uL Final  . RBC 09/21/2016 5.17  4.22 - 5.81 MIL/uL Final  . Hemoglobin 09/21/2016 14.5  13.0 - 17.0 g/dL Final  . HCT 09/21/2016 40.3  39.0 - 52.0 % Final  . MCV 09/21/2016 77.9* 78.0 - 100.0 fL Final  . MCH 09/21/2016 28.0  26.0 - 34.0 pg Final  . MCHC 09/21/2016 36.0  30.0 - 36.0 g/dL Final  . RDW 09/21/2016 14.0  11.5 - 15.5 % Final  . Platelets 09/21/2016 80* 150 - 400 K/uL Final   Comment: PLATELET COUNT CONFIRMED BY SMEAR SPECIMEN CHECKED FOR CLOTS   . Neutrophils Relative % 09/21/2016 66  % Final  . Neutro Abs 09/21/2016 2.2  1.7 - 7.7 K/uL Final  . Lymphocytes Relative 09/21/2016 21  % Final  . Lymphs Abs 09/21/2016 0.7  0.7 - 4.0 K/uL Final  . Monocytes Relative 09/21/2016 11  % Final  . Monocytes Absolute 09/21/2016 0.4  0.1 - 1.0 K/uL Final  . Eosinophils Relative 09/21/2016 2  % Final  . Eosinophils Absolute 09/21/2016 0.1  0.0 - 0.7 K/uL Final  . Basophils Relative 09/21/2016 0  % Final  . Basophils Absolute 09/21/2016 0.0  0.0 - 0.1 K/uL Final  .  Sodium 09/21/2016 137  135 - 145 mmol/L Final  . Potassium 09/21/2016 4.4  3.5 - 5.1 mmol/L Final  . Chloride 09/21/2016 102  101 - 111 mmol/L Final  . CO2 09/21/2016 28  22 - 32 mmol/L Final  . Glucose, Bld 09/21/2016 115* 65 - 99 mg/dL Final  . BUN 09/21/2016 30* 6 - 20 mg/dL Final  . Creatinine, Ser 09/21/2016 1.02  0.61 - 1.24 mg/dL Final  . Calcium 09/21/2016 9.5  8.9 - 10.3 mg/dL Final  . Total Protein 09/21/2016 7.3  6.5 - 8.1 g/dL Final  . Albumin 09/21/2016 4.3  3.5 - 5.0 g/dL Final  . AST 09/21/2016 27  15 - 41 U/L Final  . ALT 09/21/2016 37  17 - 63 U/L Final  . Alkaline Phosphatase 09/21/2016 79  38 - 126 U/L Final  . Total Bilirubin 09/21/2016 0.6  0.3 - 1.2 mg/dL Final  . GFR calc non Af Amer  09/21/2016 >60  >60 mL/min Final  . GFR calc Af Amer 09/21/2016 >60  >60 mL/min Final   Comment: (NOTE) The eGFR has been calculated using the CKD EPI equation. This calculation has not been validated in all clinical situations. eGFR's persistently <60 mL/min signify possible Chronic Kidney Disease.   . Anion gap 09/21/2016 7  5 - 15 Final  . IgG (Immunoglobin G), Serum 09/21/2016 1046  700 - 1,600 mg/dL Final  . IgA 09/21/2016 83* 90 - 386 mg/dL Final  . IgM, Serum 09/21/2016 80  20 - 172 mg/dL Final  . Total Protein ELP 09/21/2016 7.1  6.0 - 8.5 g/dL Corrected  . Albumin SerPl Elph-Mcnc 09/21/2016 3.8  2.9 - 4.4 g/dL Corrected  . Alpha 1 09/21/2016 0.2  0.0 - 0.4 g/dL Corrected  . Alpha2 Glob SerPl Elph-Mcnc 09/21/2016 0.7  0.4 - 1.0 g/dL Corrected  . B-Globulin SerPl Elph-Mcnc 09/21/2016 1.0  0.7 - 1.3 g/dL Corrected  . Gamma Glob SerPl Elph-Mcnc 09/21/2016 1.3  0.4 - 1.8 g/dL Corrected  . M Protein SerPl Elph-Mcnc 09/21/2016 Not Observed  Not Observed g/dL Corrected  . Globulin, Total 09/21/2016 3.3  2.2 - 3.9 g/dL Corrected  . Albumin/Glob SerPl 09/21/2016 1.2  0.7 - 1.7 Corrected  . IFE 1 09/21/2016 Comment   Corrected   An apparent normal immunofixation pattern.  . Please Note 09/21/2016 Comment   Corrected   Comment: (NOTE) Protein electrophoresis scan will follow via computer, mail, or courier delivery. Performed At: [facility] [place], [place] [id number] Lindon Romp MD RD:[id number]   . Vitamin B-12 09/21/2016 427  180 - 914 pg/mL Final   Comment: (NOTE) This assay is not validated for testing neonatal or myeloproliferative syndrome specimens for Vitamin B12 levels. Performed at [facility], [place] [address], [place], [place] [address]   . Folate 09/21/2016 12.4  >5.9 ng/mL Final   Performed at [facility], [place] [address] [place], [place] [address]  Office Visit on 09/21/2016  Component Date Value Ref Range Status  . JAK2  GenotypR 09/21/2016 Comment   Final   Comment: (NOTE) Result: NEGATIVE for the JAK2 V617F mutation. Interpretation:  The G to T nucleotide change encoding the V617F mutation was not detected.  This result does not rule out the presence of the JAK2 mutation at a level below the sensitivity of detection of this assay, or the presence of other mutations within JAK2 not detected by this assay.  This result does not rule out a diagnosis of  polycythemia vera, essential thrombocythemia or idiopathic myelofibrosis as the V617F mutation is not detected in all patients with these disorders.   Marland Kitchen BACKGROUND: 09/21/2016 Comment   Final   Comment: (NOTE) JAK2 is a cytoplasmic tyrosine kinase with a key role in signal transduction from multiple hematopoietic growth factor receptors. A point mutation within exon 14 of the JAK2 gene ([id number]) encoding a valine to phenylalanine substitution at position 617 of the JAK2 protein (V617F) has been identified in most patients with polycythemia vera, and in about half of those with either essential thrombocythemia or idiopathic myelofibrosis. The V617F has also been detected, although infrequently, in other myeloid disorders such as chronic myelomonocytic leukemia and chronic neutrophilic luekemia. V617F is an acquired mutation that alters a highly conserved valine present in the negative regulatory JH2 domain of the JAK2 protein and is predicted to dysregulate kinase activity. Methodology: Total genomic DNA was extracted and subjected to TaqMan real-time PCR amplification/detection. Two amplification products per sample were monitored by real-time PCR using primers/probes s                          pecific to JAK2 wild type (WT) and JAK2 mutant V617F. The ABI7900 Absolute Quantitation software will compare the patient specimen valuse to the standard curves and generate percent values for wild type and mutant type. In vitro studies have indicated that this  assay has an analytical sensitivity of 1%. References: Baxter EJ, Scott Phineas Real, et al. Acquired mutation of the tyrosine kinase JAK2 in human myeloproliferative disorders. Lancet. 2005 Mar 19-25; 365(9464):1054-1061. Alfonso Ramus Couedic JP. A unique clonal JAK2 mutation leading to constitutive signaling causes polycythaemia vera. Nature. 2005 Apr 28; 434(7037):1144-1148. Kralovics R, Passamonti F, Buser AS, et al. A gain-of-function mutation of JAK2 in myeloproliferative disorders. N Engl J Med. 2005 Apr 28; 352(17):1779-1790.   . Director Review, JAK2 09/21/2016 Comment   Final   Comment: (NOTE) Constance Goltz, PhD, [facility]               Director, [place] for [place] and [place], [place]   . REFLEX: 09/21/2016 Comment   Final   Comment: (NOTE) Reflex to CALR Mutation Analysis, JAK2 Exon 12-15 Mutation Analysis, and MPL Mutation Analysis is indicated.   Marland Kitchen Extraction 09/21/2016 Completed   Corrected   Comment: (NOTE) Performed At: [facility] [address] [place], [place] [id number] Nechama Guard MD LP:[id number] Performed At: [facility] [place], [place] [id number] Nechama Guard MD ST:[id number]   . CALR Mutation Detection Result 09/21/2016 Comment   Final   Comment: (NOTE) NEGATIVE No insertions or deletions were detected within the analyzed region of the calreticulin (CALR) gene. A negative result does not entirely exclude the possibility of a clonal population carrying CALR gene mutations that are not covered by this assay. Results should be interpreted in conjunction with clinical and laboratory findings for the most accurate interpretation.   . Background: 09/21/2016 Comment   Final   Comment: (NOTE) The calcium-binding endoplasmic reticulin chaperone protein, calreticulin (CALR), is  somatically mutated  in approximately 70% of patients with JAK2-negative essential thrombocythemia (ET) and 60- 88% of patients with JAK2-negative primary myelofibrosis(PMF). Only a minority of patients (approximately 8%) with myelodysplasia have mutations in  CALR gene. CALR mutations are rarely detected in patients with de novo acute myeloid leukemia, chronic myelogenous leukemia, lymphoid leukemia, or solid tumors. CALR mutations are not detected in polycythemia and generally appear to be mutually exclusive with JAK2 mutations and MPL mutations. The majority of mutational changes involve a variety of insertion or deletion mutations in exon 9 of the calreticulin gene: approximately 53% of all CALR mutations are a 52 bp deletion (type-1) while the second most prevalent mutation (approximately 32%) contains a 5 bp insertion (type-2). Other mutations (non-type 1 or type 2) are seen                           in a small minority of cases. CALR mutations in PMF tend to be associated with a favorable prognosis compared to JAK2 V617F mutations, whereas primary myelofibrosis negative for CALR, JAK2 V617F and MPL mutations (so-called triple negative) is associated with a poor prognosis and shorter survival. The detection of a CALR gene mutation aids in the specific diagnosis of a myeloproliferative neoplasm, and help distinguish this clonal disease from a benign reactive process.   . Methodology: 09/21/2016 Comment   Final   Comment: (NOTE) Genomic DNA was isolated from the provided specimen. Polymerase chain reaction (PCR) of exon 9 of the CALR gene was performed with specific fluorescent-labeled primers, and the PCR product was analyzed by capillary gel electrophoresis to determine the size of the PCR products. This PCR assay is capable of detecting a mutant cell population with a sensitivity of 5 mutant cells per 100 normal cells. A negative result does not exclude the presence of  a myeloproliferative disorder or other neoplastic process. This test was developed and its performance characteristics determined by [facility]. It has not been cleared or approved by the Food and Drug Administration. The FDA has determined that such clearance or approval is not necessary.   . References: 09/21/2016 Comment   Final   Comment: (NOTE) 1. Klampfel, T. et al. (2013) Somatic mutations of calreticulin in   myeloproliferative neoplasms. New Engl. J. Med. 962:2297-9892. 2. Haynes Kerns et al. (2013) Somatic CALR mutations in   myeloproliferative neoplasms with nonmutated JAK2. [place] Engl. J.   Med. [phone].   Marland Kitchen Director Review 09/21/2016 Comment   Final   Comment: (NOTE) Constance Goltz, PhD, [facility]               Director, [place] for [place] and [place], [place]   . JAK2 Exons 12-15 Mut Det PCR: 09/21/2016 Comment   Final   Comment: (NOTE) NEGATIVE JAK2 mutations were not detected in exons 12, 13, 14 and 15. This result does not rule out the presence of JAK2 mutation at a level below the detection sensitivity of this assay, the presence of other mutations outside the analyzed region of the JAK2 gene, or the presence of a myeloproliferative or other neoplasm. Result must be correlated with other  clinical data for the most accurate diagnosis.   . Indications 09/21/2016 [place]   Final  . Specimen Type 09/21/2016 Comment   Final   No specimen type provided.  Marland Kitchen BACKGROUND: 09/21/2016 Comment   Final   Comment: (NOTE) JAK2 V617F mutation is detected in patients with polycythemia vera (95%), essential thrombocythemia (50%) and primary myelofibrosis (50%). A small percentage of JAK2 mutation positive patients (3.3%) contain other non-V617F mutations within exons 12 to 15. The detection of a JAK2 gene mutation aids in the specific diagnosis of  a myeloproliferative neoplasm, and help distinguish this clonal disease from a benign reactive process.   . Method 09/21/2016 Comment   Final   Comment: (NOTE) Total RNA was purified from the provided specimen. The JAK2 gene region covering exons 12 to 15 was subjected to reverse- transcription coupled PCR amplification, and bi-directional sequencing to identify sequence variations. This assay has a sensitivity to detect approximately 15% population of cells containing the JAK2 mutations in a background of non-mutant cells. This test was developed and its performance characteristics determined by [facility]. It has not been cleared or approved by the Food and Drug Administration.   . References 09/21/2016 Comment   Final   Comment: (NOTE) Algasham, N. et al. Detection of mutations in JAK2 exons 12-15 by Sanger sequencing. Int J Lab Hemato. 2015, 38:34-41. Joelene Millin al. Mutation profile of JAK2 transcripts in patients with chronic myeloproliferative neoplasias. J Mol Diagn. 2009, 11:49-53.   Marland Kitchen DIRECTOR REVIEW: 09/21/2016 Comment   Final   Comment: (NOTE) Constance Goltz, PhD, [facility]               Director, [place] for [place] and [place], [place]   . MPL MUTATION ANALYSIS RESULT: 09/21/2016 Comment   Final   Comment: (NOTE) No MPL mutation was identified in the provided specimen of this individual. Results should be interpreted in conjunction with clinical and other laboratory findings for the most accurate interpretation.   Marland Kitchen BACKGROUND: 09/21/2016 Comment   Final   Comment: (NOTE) MPL (myeloproliferative leukemia virus oncogene homology) belongs to the hematopoietin superfamily and enables its ligand thrombopoietin to facilitate both global hematopoiesis and megakaryocyte growth and differentiation. MPL W515 mutations are present in patients with primary  myelofibrosis (PMF) and essential thrombocythemia (ET) at a frequency of approximately 5% and 1% respectively. The S505 mutation is detected in patients with hereditary thrombocythemia.   Marland Kitchen METHODOLOGY: 09/21/2016 Comment   Final   Comment: (NOTE) Genomic DNA was purified from the provided specimen. MPL gene region covering the S505N and W515L/K mutations were subjected to PCR amplification and bi-directional sequencing in duplicate to identify sequence variations. This assay has a sensitivity to detect approximately 20-25% population of cells containing the MPL mutations in a background of non-mutant cells. This assay will not detect the mutation below the sensitivity of this assay. Molecular- based testing is highly accurate, but as in any laboratory test, rare diagnostic errors may occur.   Marland Kitchen REFERENCES: 09/21/2016 Comment   Final   Comment: (NOTE) 1. Pardanani AD, et al. (2006). MPL515 mutations in   myeloproliferative and other myeloid disorders: a study  of 1182 patients. Blood 623:7628-3151. 2. Andre Lefort and Levine RL. (2008). JAK2 and MPL   mutations in myeloproliferative neoplasms: discovery and   science. Leukemia 22:1813-1817. 3. Juline Patch, et al. (2009). Evidence for a founder effect   of the MPL-S505N mutation in eight [place] pedigrees with   hereditary thrombocythemia. Haematologica 94(10):1368-   7616.   Marland Kitchen DIRECTOR REVIEW: 09/21/2016 Comment   Final   Comment: (NOTE) Constance Goltz, PhD, [facility]               Director, [place] for Molecular               Biology and [place], [place]               [phone] Performed At: [facility] [place], [place] [id number] Nechama Guard MD RS:[id number] Performed At: [facility] [address] [place], [place] [id number] Nechama Guard MD BZ:[id number]     RADIOGRAPHIC STUDIES: I have personally reviewed  the radiological images as listed and agree with the findings in the report  No results found.  ASSESSMENT/PLAN 46 year old male presents for evaluation for chronic thrombocytopenia of at least 10 years.  PLAN: - Patient most likely has ITP. I have explained to him and his wife that there may be fluctuations in his platelet count due to infection/inflammation/stress. There is no treatment necessary at this time. At this point in time all the workup with JAK  mutation has been negative.  Platelet count is improved to 80,000 patient remains asymptomatic.  Patient is not enthusiastic about bone marrow would like to observe platelet count.  Patient wanted primary care physician to repeat platelet count and if it drops below 50,000 will contact us for further workup with bone marrow aspiration biopsy and antiplatelet antibodies and antinuclear antibodies can be assessed.   No orders of the defined types were placed in this encounter. Addendum Retrospectively CT scan has been reviewed which was done in November which revealed splenomegaly and multiple low density lesions.  Possibility of found lymphoproliferative disease cannot be ruled out.  Repeating CT scan of abdomen to rule out any enlargement of the lymph node and bone marrow aspiration biopsy would be highly recommended.  We will contact this patient and convinced him to get another CT scan as well as bone marrow aspiration and biopsy  All questions were answered. The patient knows to call the clinic with any problems, questions or concerns.  This note was electronically signed.    Forest Gleason, MD  10/12/2016 8:43 AM

## 2016-10-12 NOTE — Addendum Note (Signed)
Addended by: Richardson ChiquitoRAVIS, Michelene [place] M on: 10/12/2016 10:29 AM   Modules accepted: Orders

## 2017-01-18 ENCOUNTER — Ambulatory Visit (HOSPITAL_COMMUNITY): Payer: BLUE CROSS/BLUE SHIELD

## 2017-01-18 ENCOUNTER — Other Ambulatory Visit (HOSPITAL_COMMUNITY): Payer: BLUE CROSS/BLUE SHIELD

## 2017-01-22 DIAGNOSIS — Z23 Encounter for immunization: Secondary | ICD-10-CM | POA: Diagnosis not present

## 2017-07-05 ENCOUNTER — Other Ambulatory Visit (INDEPENDENT_AMBULATORY_CARE_PROVIDER_SITE_OTHER): Payer: BLUE CROSS/BLUE SHIELD

## 2017-07-05 DIAGNOSIS — Z13220 Encounter for screening for lipoid disorders: Secondary | ICD-10-CM

## 2017-07-05 DIAGNOSIS — R739 Hyperglycemia, unspecified: Secondary | ICD-10-CM

## 2017-07-05 DIAGNOSIS — R5383 Other fatigue: Secondary | ICD-10-CM

## 2017-07-05 LAB — LIPID PANEL
Cholesterol: 130 mg/dL (ref 0–200)
HDL: 44.3 mg/dL (ref >39.00)
LDL Cholesterol: 73 mg/dL (ref 0–99)
NonHDL: 85.67
Total CHOL/HDL Ratio: 3
Triglycerides: 63 mg/dL (ref 0.0–149.0)
VLDL: 12.6 mg/dL (ref 0.0–40.0)

## 2017-07-05 LAB — CBC WITH DIFFERENTIAL/PLATELET
Basophils Absolute: 0 10*3/uL (ref 0.0–0.1)
Basophils Relative: 0.2 % (ref 0.0–3.0)
Eosinophils Absolute: 0 10*3/uL (ref 0.0–0.7)
Eosinophils Relative: 1 % (ref 0.0–5.0)
HCT: 41 % (ref 39.0–52.0)
Hemoglobin: 14.2 g/dL (ref 13.0–17.0)
Lymphocytes Relative: 21 % (ref 12.0–46.0)
Lymphs Abs: 0.7 10*3/uL (ref 0.7–4.0)
MCHC: 34.8 g/dL (ref 30.0–36.0)
MCV: 81 fl (ref 78.0–100.0)
Monocytes Absolute: 0.3 10*3/uL (ref 0.1–1.0)
Monocytes Relative: 9.3 % (ref 3.0–12.0)
Neutro Abs: 2.3 10*3/uL (ref 1.4–7.7)
Neutrophils Relative %: 68.5 % (ref 43.0–77.0)
Platelets: 92 10*3/uL — ABNORMAL LOW (ref 150.0–400.0)
RBC: 5.05 Mil/uL (ref 4.22–5.81)
RDW: 15.1 % (ref 11.5–15.5)
WBC: 3.5 10*3/uL — ABNORMAL LOW (ref 4.0–10.5)
nRBC: 0 /100 WBC (ref 0–4)

## 2017-07-05 LAB — COMPREHENSIVE METABOLIC PANEL
ALT: 28 U/L (ref 0–53)
AST: 21 U/L (ref 0–37)
Albumin: 4.4 g/dL (ref 3.5–5.2)
Alkaline Phosphatase: 60 U/L (ref 39–117)
BUN: 18 mg/dL (ref 6–23)
CO2: 31 mEq/L (ref 19–32)
Calcium: 9.7 mg/dL (ref 8.4–10.5)
Chloride: 105 mEq/L (ref 96–112)
Creatinine, Ser: 1.12 mg/dL (ref 0.40–1.50)
GFR: 74.95 mL/min (ref >60.00)
Glucose, Bld: 95 mg/dL (ref 70–99)
Potassium: 4.3 mEq/L (ref 3.5–5.1)
Sodium: 140 mEq/L (ref 135–145)
Total Bilirubin: 0.7 mg/dL (ref 0.2–1.2)
Total Protein: 6.9 g/dL (ref 6.0–8.3)

## 2017-07-05 LAB — HEMOGLOBIN A1C: Hgb A1c MFr Bld: 4.9 % (ref 4.6–6.5)

## 2017-07-05 NOTE — Addendum Note (Signed)
Addended by: London SheerFRIZZELL, Elvie Palomo T on: 07/05/2017 08:03 AM   Modules accepted: Orders

## 2017-07-05 NOTE — Addendum Note (Signed)
Addended by: FRIZZELL, BAILEY T on: 07/05/2017 08:03 AM   Modules accepted: Orders  

## 2017-07-05 NOTE — Addendum Note (Signed)
Addended by: London SheerFRIZZELL, Mireyah Chervenak T on: 07/05/2017 08:02 AM   Modules accepted: Orders

## 2017-07-14 ENCOUNTER — Encounter: Payer: Self-pay | Admitting: Surgical

## 2017-08-13 DIAGNOSIS — L0231 Cutaneous abscess of buttock: Secondary | ICD-10-CM | POA: Diagnosis not present

## 2017-08-29 IMAGING — CT CT ABD-PELV W/ CM
2 of 5 series · 9 of 46 positions shown, 10 images · IV contrast (Iodine)
Comparison: None.

CLINICAL DATA: Abdominal bloating and discomfort for 5 days. Fever.

EXAM:
CT ABDOMEN AND PELVIS WITH CONTRAST
TECHNIQUE: Multidetector CT imaging of the abdomen and pelvis was performed
using the standard protocol following bolus administration of
intravenous contrast.
CONTRAST:  100mL [id number] IOPAMIDOL ([id number]-255) INJECTION 61%

[Series 201: routine, idose (2) · axial · 0.78mm/px · z∈[+144,+554]mm · 6 of 108 slices shown, 7 images]
[im 13/108  soft-tissue]
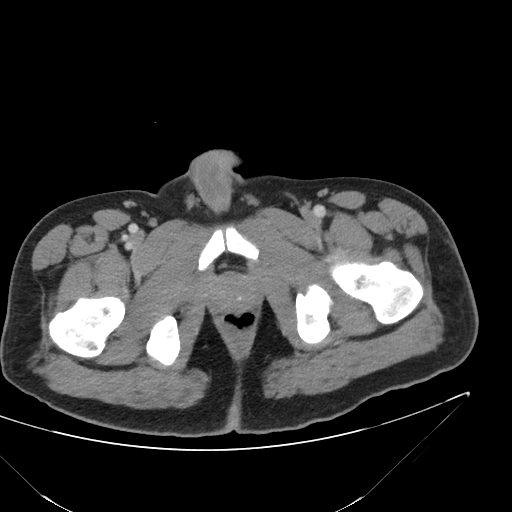
[im 13/108  bone]
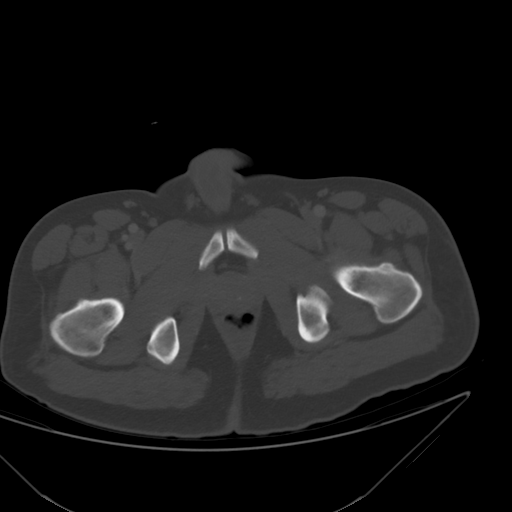
[im 32/108  soft-tissue]
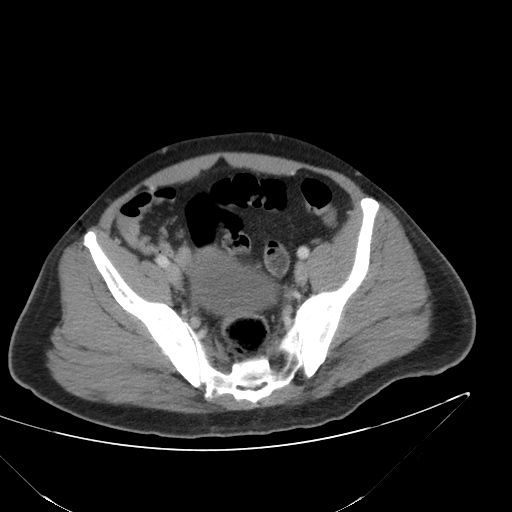
[im 45/108  soft-tissue]
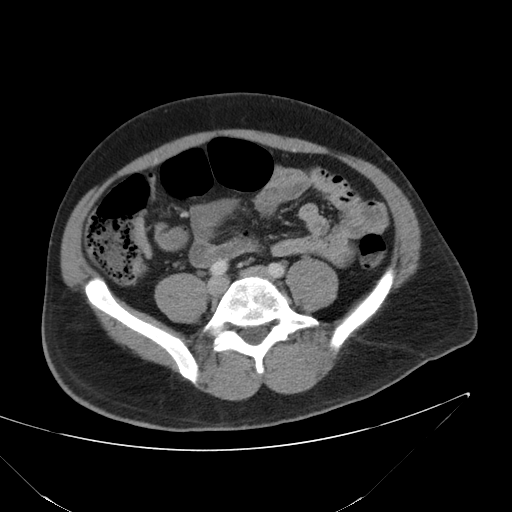
[im 63/108  soft-tissue]
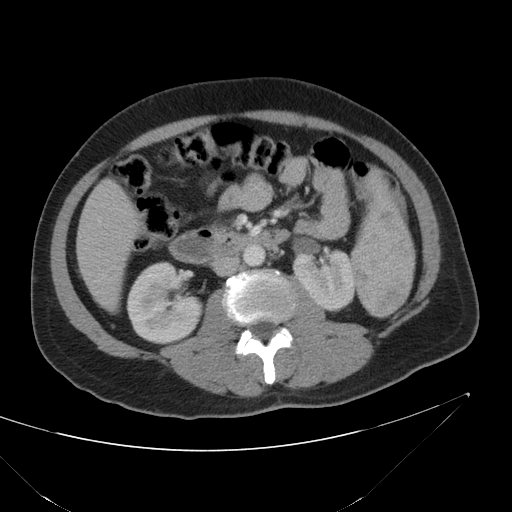
[im 82/108  soft-tissue]
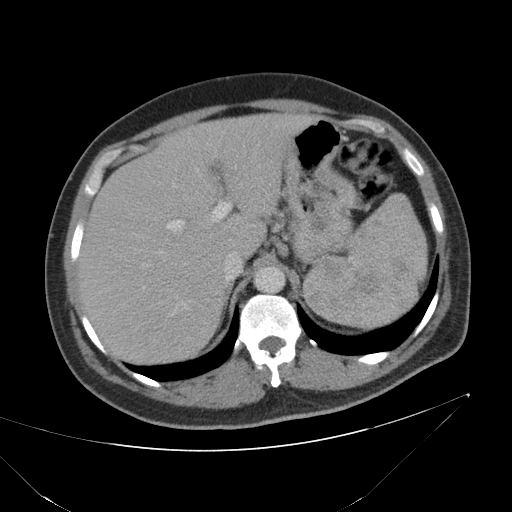
[im 95/108  soft-tissue]
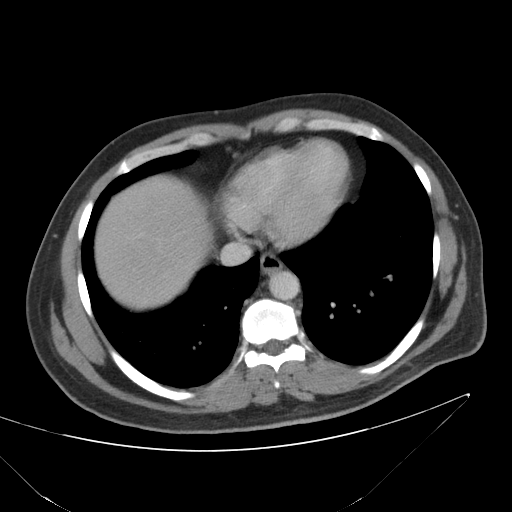

[Series 203: coronals, idose (2) · coronal · 0.45mm/px · 3 of 154 slices shown]
[im 52/154  soft-tissue]
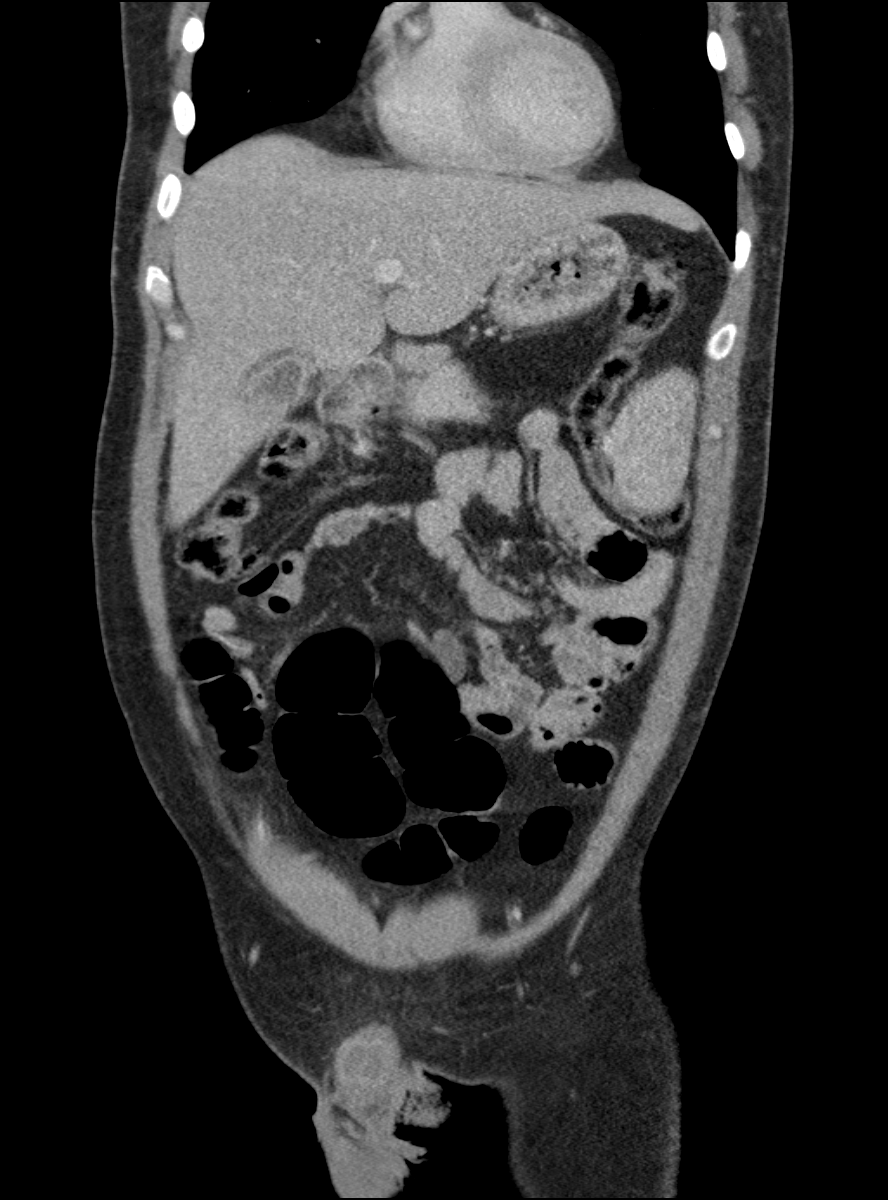
[im 69/154  soft-tissue]
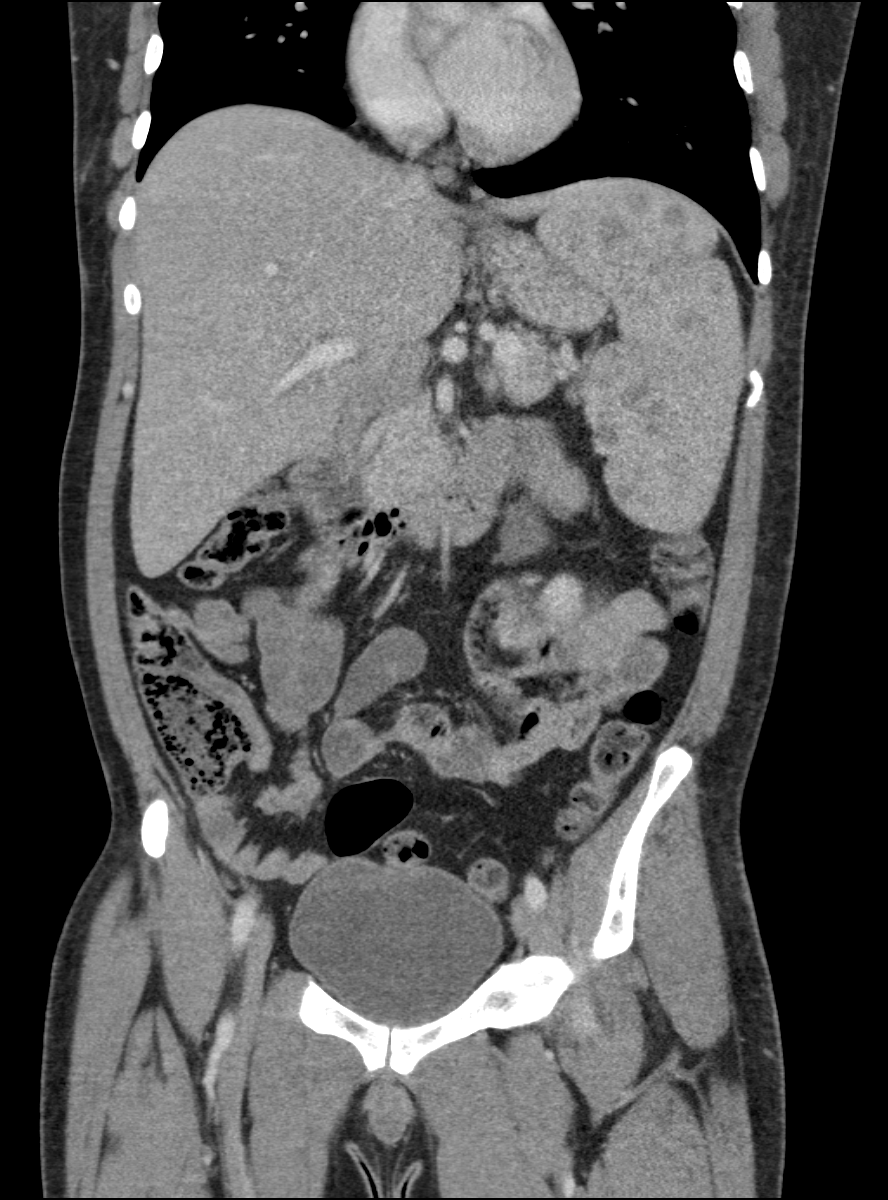
[im 86/154  soft-tissue]
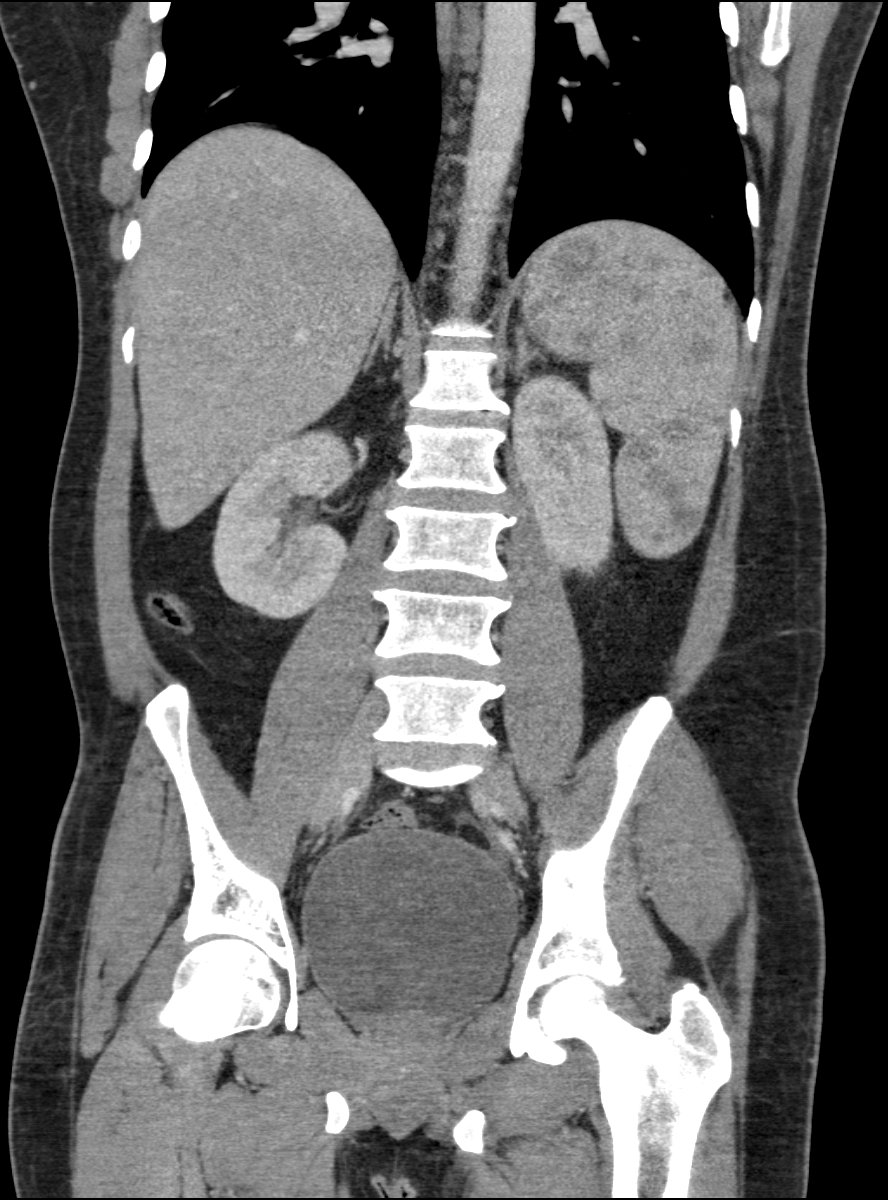

[9 of 46 positions shown; findings below may reference images not displayed]

FINDINGS: Lower Chest: No acute findings.

Hepatobiliary: No mass identified. Gallbladder is unremarkable. No
evidence of biliary dilatation.

Pancreas:  No mass or inflammatory changes.

Spleen: Mild to moderate splenomegaly, with spleen measuring 16 cm
in length. Numerous small ill-defined low-attenuation lesions are
seen throughout the splenic parenchyma.

Adrenals/Urinary Tract: No masses identified. No evidence of
hydronephrosis. Several tiny 2-3 mm nonobstructive right renal
calculi are identified. No evidence of ureteral calculi or
dilatation. Unopacified urinary bladder is unremarkable in
appearance.

Stomach/Bowel: No evidence of obstruction, inflammatory process or
abnormal fluid collections. Normal appendix visualized.

Vascular/Lymphatic: No pathologically enlarged lymph nodes. No
abdominal aortic aneurysm. Aortic atherosclerosis.

Reproductive:  No mass identified.

Other:  None.

Musculoskeletal:  No suspicious bone lesions identified.
IMPRESSION: Splenomegaly with diffuse involvement by small ill-defined
low-attenuation lesions. Differential diagnosis includes splenic
abscesses/infectious etiologies, lymphoproliferative disorder,
sarcoidosis, and metastatic disease. No other soft tissue masses or
lymphadenopathy within the abdomen or pelvis. Recommend correlation
for clinical or laboratory signs of infectious etiologies and
lymphoproliferative disorder.

Nonobstructive right nephrolithiasis. No evidence of ureteral
calculi or hydronephrosis.

## 2017-08-29 IMAGING — CR DG CHEST 1V PORT
1 series · 1 of 1 positions shown · non-contrast
Comparison: 05/04/2013

CLINICAL DATA: Fever. Sepsis. Thrombocytopenia. Hyperbilirubinemia.

EXAM:
PORTABLE CHEST 1 VIEW

[AP]
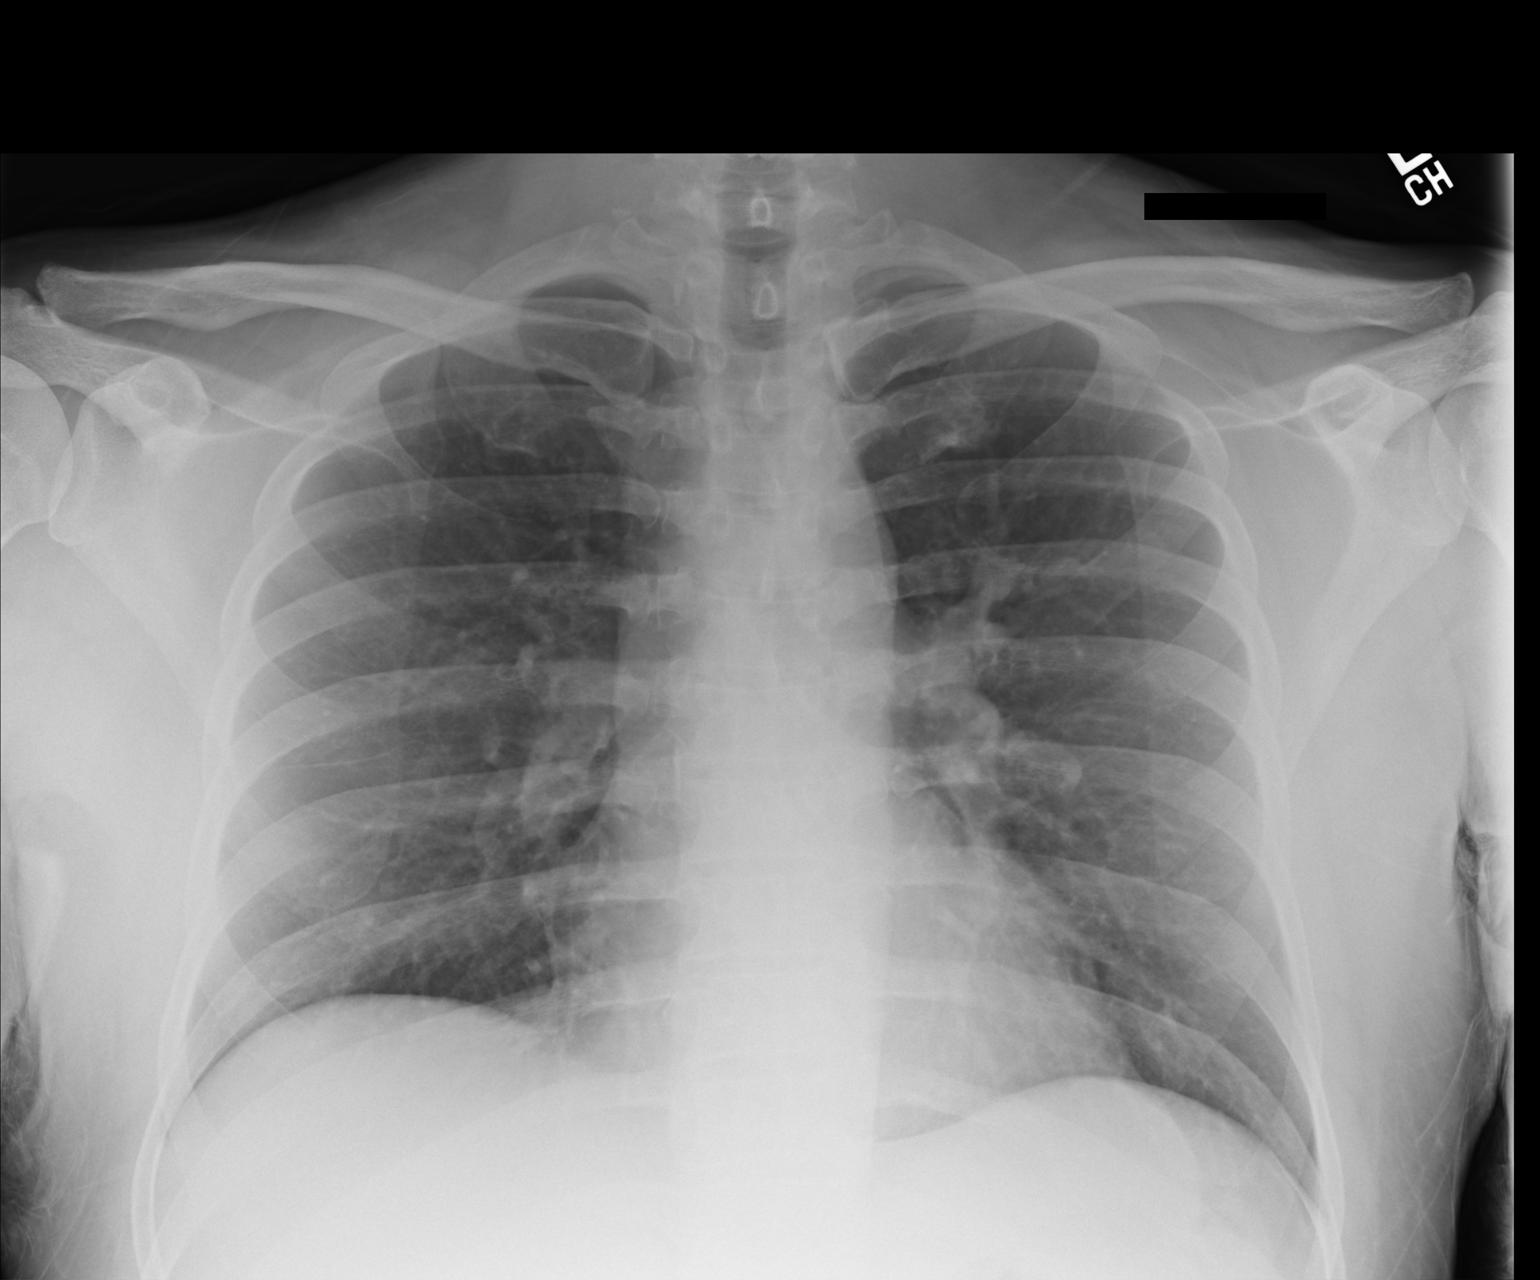

[1 of 1 positions shown; findings below may reference images not displayed]

FINDINGS: The heart size and mediastinal contours are within normal limits.
Both lungs are clear. The visualized skeletal structures are
unremarkable.
IMPRESSION: No active disease.

## 2018-03-10 DIAGNOSIS — Z23 Encounter for immunization: Secondary | ICD-10-CM | POA: Diagnosis not present

## 2019-01-05 DIAGNOSIS — Z23 Encounter for immunization: Secondary | ICD-10-CM | POA: Diagnosis not present

## 2019-06-12 ENCOUNTER — Telehealth: Payer: BLUE CROSS/BLUE SHIELD

## 2019-06-12 ENCOUNTER — Encounter: Payer: Self-pay | Admitting: Family Medicine

## 2019-06-12 ENCOUNTER — Ambulatory Visit (INDEPENDENT_AMBULATORY_CARE_PROVIDER_SITE_OTHER): Payer: BC Managed Care – PPO | Admitting: Family Medicine

## 2019-06-12 VITALS — Ht 71.0 in | Wt 188.0 lb

## 2019-06-12 DIAGNOSIS — Z23 Encounter for immunization: Secondary | ICD-10-CM | POA: Diagnosis not present

## 2019-06-12 DIAGNOSIS — D693 Immune thrombocytopenic purpura: Secondary | ICD-10-CM | POA: Diagnosis not present

## 2019-06-12 NOTE — Progress Notes (Signed)
Left a voicemail for patient to call us back to get this scheduled.

## 2019-06-12 NOTE — Progress Notes (Signed)
Virtual Visit via Video Note  Subjective  CC:  Chief Complaint  Patient presents with  . Idiopathic thrombocytopenic purpura    concerns about getting covid vaccine with ITP   New pt to me. Has TOC appt in march. Needs advice now. I reviewed his chart.  I connected with Joshua Wagner on 06/12/19 at  4:20 PM EST by a video enabled telemedicine application and verified that I am speaking with the correct person using two identifiers. Location patient: Home Location provider: [facility] [address] Office Persons participating in the virtual visit: Joshua Wagner, Joshua Ora, MD Joshua Wagner, CMA  I discussed the limitations of evaluation and management by telemedicine and the availability of in person appointments. The patient expressed understanding and agreed to proceed. HPI: Joshua Wagner is a 49 y.o. male who was contacted today to address the problems listed above in the chief complaint. . 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 Had acute crisis of ITP at that time thought to be related to septra. Had seen hem/onc. Did not follow up. Has done well since.  . Now is scheduled to get moderna covid vaccine next week and has several concerns due to recent reports of ITP induced problems after vaccination. Pt feels well. Denies bruising,bleeding or petechiae. His wife has many questions. She'd like him to be seen by hematology prior to getting the vaccine which is schedule for Monday feb 15th.  . He has never had any complications from other vaccinations. He did take the flu shot last year.  Assessment  1. Thrombocytopenia, idiopathic (HCC)   2. High priority for COVID-19 virus vaccination      Plan   ITP:  Will assess stability with CBC tomorrow; if he chooses to take the vaccine, he will return 48 hours after first dose and then in a week for recheck. Lab appts to be scheduled and labs ordered.   Discussed current recommendation  from the [place] society of hematology: benefits outweigh risks and the vaccine is recommended. Discussed possibility, albeit rare of ITP exacerbation. He does carry higher risk of complications if he gets covid infection as well.   Refer back to hematology for follow up and further monitoring.   HM: recommend regular appointments for HM/cpe and labs. He has toc appt already scheduled.   Total time of visit including record review, review of mutliple CBC,lab reports, ct scans, hematology notes, office visit and answering questions, and formulating follow up plan with lab tests ordered was 45 minutes.  I discussed the assessment and treatment plan with the patient. The patient was provided an opportunity to ask questions and all were answered. The patient agreed with the plan and demonstrated an understanding of the instructions.   The patient was advised to call back or seek an in-person evaluation if the symptoms worsen or if the condition fails to improve as anticipated. Follow up: Return for as scheduled. lab visits to monitor cbc.  07/26/2019  No orders of the defined types were placed in this encounter.     I reviewed the patients updated PMH, FH, and SocHx.    Patient Active Problem List   Diagnosis Date Noted  . Nephrolithiasis 08/11/2016  . Multiple lung nodules 08/11/2016  . Splenic lesion 05/03/2016  . Splenomegaly 05/03/2016  . Thrombocytopenia, idiopathic (HCC)    No outpatient medications have been marked as taking for the 06/12/19 encounter (Office Visit) with Joshua Partridge  L, MD.    Allergies: Patient is allergic to bactrim [sulfamethoxazole-trimethoprim]; penicillins; and latex. Family History: Patient family history includes Diabetes in his brother, father, maternal grandfather, and mother; Heart disease in his maternal grandfather. Social History:  Patient  reports that he has never smoked. He has never used smokeless tobacco. He reports that he does not drink  alcohol or use drugs.  Review of Systems: Constitutional: Negative for fever malaise or anorexia Cardiovascular: negative for chest pain Respiratory: negative for SOB or persistent cough Gastrointestinal: negative for abdominal pain  OBJECTIVE Vitals: Ht 5\' 11"  (1.803 m)   Wt 188 lb (85.3 kg)   BMI 26.22 kg/m  General: no acute distress , A&Ox3 He appears well.   Leamon Arnt, MD

## 2019-06-13 ENCOUNTER — Other Ambulatory Visit (INDEPENDENT_AMBULATORY_CARE_PROVIDER_SITE_OTHER): Payer: BC Managed Care – PPO

## 2019-06-13 ENCOUNTER — Other Ambulatory Visit: Payer: Self-pay

## 2019-06-13 DIAGNOSIS — D693 Immune thrombocytopenic purpura: Secondary | ICD-10-CM

## 2019-06-13 LAB — CBC WITH DIFFERENTIAL/PLATELET
Basophils Absolute: 0 10*3/uL (ref 0.0–0.1)
Basophils Relative: 0.3 % (ref 0.0–3.0)
Eosinophils Absolute: 0 10*3/uL (ref 0.0–0.7)
Eosinophils Relative: 0.6 % (ref 0.0–5.0)
HCT: 42.6 % (ref 39.0–52.0)
Hemoglobin: 14.7 g/dL (ref 13.0–17.0)
Lymphocytes Relative: 20.9 % (ref 12.0–46.0)
Lymphs Abs: 1.2 10*3/uL (ref 0.7–4.0)
MCHC: 34.5 g/dL (ref 30.0–36.0)
MCV: 80.1 fl (ref 78.0–100.0)
Monocytes Absolute: 0.4 10*3/uL (ref 0.1–1.0)
Monocytes Relative: 7.4 % (ref 3.0–12.0)
Neutro Abs: 3.9 10*3/uL (ref 1.4–7.7)
Neutrophils Relative %: 70.8 % (ref 43.0–77.0)
Platelets: 85 10*3/uL — ABNORMAL LOW (ref 150.0–400.0)
RBC: 5.32 Mil/uL (ref 4.22–5.81)
RDW: 14.1 % (ref 11.5–15.5)
WBC: 5.5 10*3/uL (ref 4.0–10.5)

## 2019-06-13 NOTE — Addendum Note (Signed)
Addended by: Young Berry T on: 06/13/2019 01:51 PM   Modules accepted: Orders

## 2019-06-13 NOTE — Addendum Note (Signed)
Addended by: Young Berry T on: 06/13/2019 11:15 AM   Modules accepted: Orders

## 2019-06-13 NOTE — Progress Notes (Signed)
cbc

## 2019-06-14 ENCOUNTER — Telehealth: Payer: Self-pay

## 2019-06-14 ENCOUNTER — Encounter: Payer: Self-pay | Admitting: Family Medicine

## 2019-06-14 NOTE — Telephone Encounter (Signed)
-----   Message from Artis Delay, MD sent at 06/14/2019  8:15 AM EST ----- Regarding: can you call and ask what days he is off or work best for his schedule? he is referred back to see me for ITP

## 2019-06-14 NOTE — Telephone Encounter (Signed)
Please schedule 8 am next week. No need labs

## 2019-06-14 NOTE — Telephone Encounter (Signed)
Called and left below message. Ask him to call the office back. ?

## 2019-06-14 NOTE — Telephone Encounter (Signed)
He called back. Wednesday works best and a early appt. Earlier the better. He starts seeing patients at 9 am on Wednesday. But he is willing to move patients if needed.

## 2019-06-17 DIAGNOSIS — Z23 Encounter for immunization: Secondary | ICD-10-CM | POA: Diagnosis not present

## 2019-06-17 NOTE — Telephone Encounter (Signed)
Left message with appt details. Ask him to call the office if needed.

## 2019-06-19 ENCOUNTER — Inpatient Hospital Stay: Payer: BC Managed Care – PPO | Attending: Hematology and Oncology | Admitting: Hematology and Oncology

## 2019-06-19 ENCOUNTER — Other Ambulatory Visit: Payer: Self-pay

## 2019-06-19 ENCOUNTER — Encounter: Payer: Self-pay | Admitting: Family Medicine

## 2019-06-19 ENCOUNTER — Encounter: Payer: Self-pay | Admitting: Hematology and Oncology

## 2019-06-19 ENCOUNTER — Other Ambulatory Visit (INDEPENDENT_AMBULATORY_CARE_PROVIDER_SITE_OTHER): Payer: BC Managed Care – PPO

## 2019-06-19 DIAGNOSIS — D7389 Other diseases of spleen: Secondary | ICD-10-CM | POA: Diagnosis not present

## 2019-06-19 DIAGNOSIS — R161 Splenomegaly, not elsewhere classified: Secondary | ICD-10-CM | POA: Diagnosis not present

## 2019-06-19 DIAGNOSIS — D693 Immune thrombocytopenic purpura: Secondary | ICD-10-CM

## 2019-06-19 LAB — CBC WITH DIFFERENTIAL/PLATELET
Basophils Absolute: 0 10*3/uL (ref 0.0–0.1)
Basophils Relative: 0.3 % (ref 0.0–3.0)
Eosinophils Absolute: 0 10*3/uL (ref 0.0–0.7)
Eosinophils Relative: 1 % (ref 0.0–5.0)
HCT: 40.4 % (ref 39.0–52.0)
Hemoglobin: 14.2 g/dL (ref 13.0–17.0)
Lymphocytes Relative: 22.7 % (ref 12.0–46.0)
Lymphs Abs: 1.1 10*3/uL (ref 0.7–4.0)
MCHC: 35.1 g/dL (ref 30.0–36.0)
MCV: 79 fl (ref 78.0–100.0)
Monocytes Absolute: 0.5 10*3/uL (ref 0.1–1.0)
Monocytes Relative: 10.1 % (ref 3.0–12.0)
Neutro Abs: 3.2 10*3/uL (ref 1.4–7.7)
Neutrophils Relative %: 65.9 % (ref 43.0–77.0)
Platelets: 74 10*3/uL — ABNORMAL LOW (ref 150.0–400.0)
RBC: 5.11 Mil/uL (ref 4.22–5.81)
RDW: 14.6 % (ref 11.5–15.5)
WBC: 4.9 10*3/uL (ref 4.0–10.5)

## 2019-06-20 ENCOUNTER — Other Ambulatory Visit: Payer: Self-pay | Admitting: Hematology and Oncology

## 2019-06-20 ENCOUNTER — Encounter: Payer: Self-pay | Admitting: Hematology and Oncology

## 2019-06-20 DIAGNOSIS — D693 Immune thrombocytopenic purpura: Secondary | ICD-10-CM

## 2019-06-20 DIAGNOSIS — Z0000 Encounter for general adult medical examination without abnormal findings: Secondary | ICD-10-CM | POA: Diagnosis not present

## 2019-06-20 NOTE — Assessment & Plan Note (Signed)
He had extensive evaluations in the past including PET CT scan which I have ordered and reviewed at the hematology tumor board The lesions are most consistent with past infection He is not symptomatic He does not need further imaging study to follow

## 2019-06-20 NOTE — Assessment & Plan Note (Signed)
The patient had extensive evaluation in the past with multiple bone marrow biopsies Overall, his chronic thrombocytopenia is most consistent with ITP I do not want to pursue further work-up as he has extensive work-up in the past He is not symptomatic We discussed the natural history of chronic ITP Lower platelet count can be brought on by stress, infection or other medications I will continue to see him once a year for further follow-up He does not need treatment unless his platelet count trending closer to 50,000

## 2019-06-20 NOTE — Assessment & Plan Note (Signed)
He has very mild splenomegaly likely due to reactive situation with past infection Observe only He is not symptomatic

## 2019-06-20 NOTE — Progress Notes (Signed)
[facility] OFFICE PROGRESS NOTE  Leamon Arnt, MD  ASSESSMENT & PLAN:  Thrombocytopenia, idiopathic (HCC) The patient had extensive evaluation in the past with multiple bone marrow biopsies Overall, his chronic thrombocytopenia is most consistent with ITP I do not want to pursue further work-up as he has extensive work-up in the past He is not symptomatic We discussed the natural history of chronic ITP Lower platelet count can be brought on by stress, infection or other medications I will continue to see him once a year for further follow-up He does not need treatment unless his platelet count trending closer to 50,000  Splenic lesion He had extensive evaluations in the past including PET CT scan which I have ordered and reviewed at the hematology tumor board The lesions are most consistent with past infection He is not symptomatic He does not need further imaging study to follow  Splenomegaly He has very mild splenomegaly likely due to reactive situation with past infection Observe only He is not symptomatic   No orders of the defined types were placed in this encounter.   The total time spent in the appointment was 20 minutes encounter with patients including review of chart and various tests results, discussions about plan of care and coordination of care plan   All questions were answered. The patient knows to call the clinic with any problems, questions or concerns. No barriers to learning was detected.    Heath Lark, MD 2/18/20218:32 AM  INTERVAL HISTORY: Joshua Wagner 49 y.o. male returns for further follow-up I have reviewed his records extensively since he was last seen here Since I saw him approximately 3 years ago, he went to [facility] and seek second opinion and underwent extensive evaluation by 2 separate hematologist He is being referred here because of persistent chronic thrombocytopenia He is not symptomatic Specifically, The  patient denies any recent signs or symptoms of bleeding such as spontaneous epistaxis, hematuria or hematochezia. He denies abdominal discomfort from his mild splenomegaly  SUMMARY OF HEMATOLOGIC HISTORY:  Joshua Wagner was originally seen because of the abnormal imaging and laboratory finding as above The patient originated from [place]. According to him, he had bone marrow biopsy done in [place] 10-12 years ago for evaluation of thrombocytopenia and was told it was unremarkable. Around November 2017, he developed perianal abscess. He was originally prescribed oral antibiotic therapy with Bactrim but he did not improve. He started to present with high-grade fever and was admitted to the hospital. I reviewed his records. He was admitted from 03/04/2016 to around 03/08/2016 for gluteal abscess. With IV antibiotics, his symptom has improved. Transesophageal echocardiogram excluded endocarditis. Screening tests for hepatitis and HIV were negative He was subsequently discharged with plan for follow-up on further review of his abnormal CBC and abnormal CT imaging which showed multiple splenic lesions worrisome for malignancy He denies further fever or chills. No other forms of lymphadenopathy Denies anorexia or abnormal weight loss Denies night sweats The patient denies any recent signs or symptoms of bleeding such as spontaneous epistaxis, hematuria or hematochezia. In 2018, PET CT scan showed no abnormal lymphadenopathy, persistent splenic lesions and very mild splenomegaly. Overall, his diagnosis is most consistent with chronic ITP and he is being observed  I have reviewed the past medical history, past surgical history, social history and family history with the patient and they are unchanged from previous note.  ALLERGIES:  is allergic to bactrim [sulfamethoxazole-trimethoprim]; penicillins; and latex.  MEDICATIONS:  Current Outpatient Medications  Medication Sig Dispense Refill  .  Multiple Vitamin (MULTIVITAMIN) tablet Take 1 tablet by mouth daily.     No current facility-administered medications for this visit.     REVIEW OF SYSTEMS:   Constitutional: Denies fevers, chills or night sweats Eyes: Denies blurriness of vision Ears, nose, mouth, throat, and face: Denies mucositis or sore throat Respiratory: Denies cough, dyspnea or wheezes Cardiovascular: Denies palpitation, chest discomfort or lower extremity swelling Gastrointestinal:  Denies nausea, heartburn or change in bowel habits Skin: Denies abnormal skin rashes Lymphatics: Denies new lymphadenopathy or easy bruising Neurological:Denies numbness, tingling or new weaknesses Behavioral/Psych: Mood is stable, no new changes  All other systems were reviewed with the patient and are negative.  PHYSICAL EXAMINATION: ECOG PERFORMANCE STATUS: 0 - Asymptomatic  Vitals:   06/19/19 0815  BP: (!) 164/93  Pulse: 72  Resp: 18  Temp: (!) 97.1 F (36.2 C)  SpO2: 100%   Filed Weights   06/19/19 0815  Weight: 195 lb 6.4 oz (88.6 kg)    GENERAL:alert, no distress and comfortable SKIN: skin color, texture, turgor are normal, no rashes or significant lesions EYES: normal, Conjunctiva are pink and non-injected, sclera clear OROPHARYNX:no exudate, no erythema and lips, buccal mucosa, and tongue normal  NECK: supple, thyroid normal size, non-tender, without nodularity LYMPH:  no palpable lymphadenopathy in the cervical, axillary or inguinal LUNGS: clear to auscultation and percussion with normal breathing effort HEART: regular rate & rhythm and no murmurs and no lower extremity edema ABDOMEN:abdomen soft, non-tender and normal bowel sounds Musculoskeletal:no cyanosis of digits and no clubbing  NEURO: alert & oriented x 3 with fluent speech, no focal motor/sensory deficits  LABORATORY DATA:  I have reviewed the data as listed     Component Value Date/Time   NA 140 07/05/2017 0808   K 4.3 07/05/2017 0808   CL  105 07/05/2017 0808   CO2 31 07/05/2017 0808   GLUCOSE 95 07/05/2017 0808   BUN 18 07/05/2017 0808   CREATININE 1.12 07/05/2017 0808   CREATININE 0.99 03/16/2016 1705   CALCIUM 9.7 07/05/2017 0808   PROT 6.9 07/05/2017 0808   ALBUMIN 4.4 07/05/2017 0808   AST 21 07/05/2017 0808   ALT 28 07/05/2017 0808   ALKPHOS 60 07/05/2017 0808   BILITOT 0.7 07/05/2017 0808   GFRNONAA >60 09/21/2016 0928   GFRNONAA >89 03/16/2016 1705   GFRAA >60 09/21/2016 0928   GFRAA >89 03/16/2016 1705    No results found for: SPEP, UPEP  Lab Results  Component Value Date   WBC 4.9 06/19/2019   NEUTROABS 3.2 06/19/2019   HGB 14.2 06/19/2019   HCT 40.4 06/19/2019   MCV 79.0 06/19/2019   PLT 74.0 (L) 06/19/2019      Chemistry      Component Value Date/Time   NA 140 07/05/2017 0808   K 4.3 07/05/2017 0808   CL 105 07/05/2017 0808   CO2 31 07/05/2017 0808   BUN 18 07/05/2017 0808   CREATININE 1.12 07/05/2017 0808   CREATININE 0.99 03/16/2016 1705      Component Value Date/Time   CALCIUM 9.7 07/05/2017 0808   ALKPHOS 60 07/05/2017 0808   AST 21 07/05/2017 0808   ALT 28 07/05/2017 0808   BILITOT 0.7 07/05/2017 3149

## 2019-06-21 ENCOUNTER — Telehealth: Payer: Self-pay | Admitting: *Deleted

## 2019-06-21 ENCOUNTER — Inpatient Hospital Stay: Payer: BC Managed Care – PPO

## 2019-06-21 ENCOUNTER — Other Ambulatory Visit: Payer: Self-pay

## 2019-06-21 ENCOUNTER — Encounter: Payer: Self-pay | Admitting: Family Medicine

## 2019-06-21 ENCOUNTER — Encounter: Payer: Self-pay | Admitting: Hematology and Oncology

## 2019-06-21 DIAGNOSIS — D693 Immune thrombocytopenic purpura: Secondary | ICD-10-CM

## 2019-06-21 DIAGNOSIS — R161 Splenomegaly, not elsewhere classified: Secondary | ICD-10-CM | POA: Diagnosis not present

## 2019-06-21 DIAGNOSIS — D7389 Other diseases of spleen: Secondary | ICD-10-CM | POA: Diagnosis not present

## 2019-06-21 LAB — CBC WITH DIFFERENTIAL/PLATELET
Abs Immature Granulocytes: 0.01 10*3/uL (ref 0.00–0.07)
Basophils Absolute: 0 10*3/uL (ref 0.0–0.1)
Basophils Relative: 0 %
Eosinophils Absolute: 0.1 10*3/uL (ref 0.0–0.5)
Eosinophils Relative: 1 %
HCT: 40.5 % (ref 39.0–52.0)
Hemoglobin: 14.1 g/dL (ref 13.0–17.0)
Immature Granulocytes: 0 %
Lymphocytes Relative: 28 %
Lymphs Abs: 1.3 10*3/uL (ref 0.7–4.0)
MCH: 27.1 pg (ref 26.0–34.0)
MCHC: 34.8 g/dL (ref 30.0–36.0)
MCV: 77.7 fL — ABNORMAL LOW (ref 80.0–100.0)
Monocytes Absolute: 0.5 10*3/uL (ref 0.1–1.0)
Monocytes Relative: 11 %
Neutro Abs: 2.7 10*3/uL (ref 1.7–7.7)
Neutrophils Relative %: 60 %
Platelets: 72 10*3/uL — ABNORMAL LOW (ref 150–400)
RBC: 5.21 MIL/uL (ref 4.22–5.81)
RDW: 13.5 % (ref 11.5–15.5)
WBC: 4.5 10*3/uL (ref 4.0–10.5)
nRBC: 0 % (ref 0.0–0.2)

## 2019-06-21 NOTE — Telephone Encounter (Signed)
Called and LVM to schedule lab appointment °

## 2019-06-21 NOTE — Telephone Encounter (Signed)
-----   Message from Artis Delay, MD sent at 06/21/2019 10:55 AM EST ----- Regarding: platelet count 72 Please call him Even though he might be concerned his platelets dropped further, in my eyes there are no difference Looks like he called both PCP and me to check his platelet count Since he is scheduled with PCP labs on Wednesday, I do not plan to recheck it

## 2019-06-21 NOTE — Telephone Encounter (Signed)
Attempted to reach patient by phone 3 times and 2 messages left. Unable to relay message as instructed below. Forwarding this communication for follow up.

## 2019-06-26 ENCOUNTER — Other Ambulatory Visit (INDEPENDENT_AMBULATORY_CARE_PROVIDER_SITE_OTHER): Payer: BC Managed Care – PPO

## 2019-06-26 ENCOUNTER — Other Ambulatory Visit: Payer: Self-pay

## 2019-06-26 ENCOUNTER — Encounter: Payer: Self-pay | Admitting: Hematology and Oncology

## 2019-06-26 DIAGNOSIS — D693 Immune thrombocytopenic purpura: Secondary | ICD-10-CM | POA: Diagnosis not present

## 2019-06-26 LAB — CBC WITH DIFFERENTIAL/PLATELET
Basophils Absolute: 0 10*3/uL (ref 0.0–0.1)
Basophils Relative: 0.3 % (ref 0.0–3.0)
Eosinophils Absolute: 0 10*3/uL (ref 0.0–0.7)
Eosinophils Relative: 1 % (ref 0.0–5.0)
HCT: 41.2 % (ref 39.0–52.0)
Hemoglobin: 14.3 g/dL (ref 13.0–17.0)
Lymphocytes Relative: 24.4 % (ref 12.0–46.0)
Lymphs Abs: 1 10*3/uL (ref 0.7–4.0)
MCHC: 34.7 g/dL (ref 30.0–36.0)
MCV: 79.9 fl (ref 78.0–100.0)
Monocytes Absolute: 0.4 10*3/uL (ref 0.1–1.0)
Monocytes Relative: 10 % (ref 3.0–12.0)
Neutro Abs: 2.6 10*3/uL (ref 1.4–7.7)
Neutrophils Relative %: 64.3 % (ref 43.0–77.0)
Platelets: 80 10*3/uL — ABNORMAL LOW (ref 150.0–400.0)
RBC: 5.15 Mil/uL (ref 4.22–5.81)
RDW: 14.5 % (ref 11.5–15.5)
WBC: 4.1 10*3/uL (ref 4.0–10.5)

## 2019-07-01 ENCOUNTER — Encounter: Payer: Self-pay | Admitting: Family Medicine

## 2019-07-01 DIAGNOSIS — D693 Immune thrombocytopenic purpura: Secondary | ICD-10-CM | POA: Diagnosis not present

## 2019-07-04 ENCOUNTER — Encounter: Payer: Self-pay | Admitting: Family Medicine

## 2019-07-04 NOTE — Telephone Encounter (Signed)
No answer. Left voicemail for patient to call office back to set up two lab appointments.

## 2019-07-04 NOTE — Telephone Encounter (Signed)
Pt is ok to come in anytime for platelet recheck correct?

## 2019-07-04 NOTE — Telephone Encounter (Signed)
Please call and schedule lab visit for march 17th or 18th and then one week afterwards. Thanks.   IF he is unhappy with this, he may choose to see his hematologist.   Thanks.

## 2019-07-15 DIAGNOSIS — Z23 Encounter for immunization: Secondary | ICD-10-CM | POA: Diagnosis not present

## 2019-07-17 ENCOUNTER — Other Ambulatory Visit: Payer: BC Managed Care – PPO

## 2019-07-19 ENCOUNTER — Other Ambulatory Visit (INDEPENDENT_AMBULATORY_CARE_PROVIDER_SITE_OTHER): Payer: BC Managed Care – PPO

## 2019-07-19 ENCOUNTER — Other Ambulatory Visit: Payer: Self-pay

## 2019-07-19 DIAGNOSIS — D693 Immune thrombocytopenic purpura: Secondary | ICD-10-CM

## 2019-07-19 LAB — CBC WITH DIFFERENTIAL/PLATELET
Basophils Absolute: 0 10*3/uL (ref 0.0–0.1)
Basophils Relative: 0.3 % (ref 0.0–3.0)
Eosinophils Absolute: 0.1 10*3/uL (ref 0.0–0.7)
Eosinophils Relative: 1.5 % (ref 0.0–5.0)
HCT: 40.2 % (ref 39.0–52.0)
Hemoglobin: 13.8 g/dL (ref 13.0–17.0)
Lymphocytes Relative: 22.8 % (ref 12.0–46.0)
Lymphs Abs: 1 10*3/uL (ref 0.7–4.0)
MCHC: 34.4 g/dL (ref 30.0–36.0)
MCV: 79.6 fl (ref 78.0–100.0)
Monocytes Absolute: 0.5 10*3/uL (ref 0.1–1.0)
Monocytes Relative: 11.9 % (ref 3.0–12.0)
Neutro Abs: 2.7 10*3/uL (ref 1.4–7.7)
Neutrophils Relative %: 63.5 % (ref 43.0–77.0)
Platelets: 82 10*3/uL — ABNORMAL LOW (ref 150.0–400.0)
RBC: 5.04 Mil/uL (ref 4.22–5.81)
RDW: 14.9 % (ref 11.5–15.5)
WBC: 4.2 10*3/uL (ref 4.0–10.5)

## 2019-07-19 NOTE — Addendum Note (Signed)
Addended by: Young Berry T on: 07/19/2019 08:08 AM   Modules accepted: Orders

## 2019-07-24 ENCOUNTER — Other Ambulatory Visit (INDEPENDENT_AMBULATORY_CARE_PROVIDER_SITE_OTHER): Payer: BC Managed Care – PPO

## 2019-07-24 ENCOUNTER — Other Ambulatory Visit: Payer: Self-pay

## 2019-07-24 DIAGNOSIS — D693 Immune thrombocytopenic purpura: Secondary | ICD-10-CM | POA: Diagnosis not present

## 2019-07-24 LAB — CBC WITH DIFFERENTIAL/PLATELET
Basophils Absolute: 0 10*3/uL (ref 0.0–0.1)
Basophils Relative: 0.4 % (ref 0.0–3.0)
Eosinophils Absolute: 0.1 10*3/uL (ref 0.0–0.7)
Eosinophils Relative: 1.3 % (ref 0.0–5.0)
HCT: 40 % (ref 39.0–52.0)
Hemoglobin: 14.1 g/dL (ref 13.0–17.0)
Lymphocytes Relative: 24.2 % (ref 12.0–46.0)
Lymphs Abs: 1.3 10*3/uL (ref 0.7–4.0)
MCHC: 35.1 g/dL (ref 30.0–36.0)
MCV: 78.9 fl (ref 78.0–100.0)
Monocytes Absolute: 0.5 10*3/uL (ref 0.1–1.0)
Monocytes Relative: 9.7 % (ref 3.0–12.0)
Neutro Abs: 3.4 10*3/uL (ref 1.4–7.7)
Neutrophils Relative %: 64.4 % (ref 43.0–77.0)
Platelets: 78 10*3/uL — ABNORMAL LOW (ref 150.0–400.0)
RBC: 5.07 Mil/uL (ref 4.22–5.81)
RDW: 14.7 % (ref 11.5–15.5)
WBC: 5.2 10*3/uL (ref 4.0–10.5)

## 2019-07-26 ENCOUNTER — Encounter: Payer: BC Managed Care – PPO | Admitting: Family Medicine

## 2020-02-01 DIAGNOSIS — Z23 Encounter for immunization: Secondary | ICD-10-CM | POA: Diagnosis not present

## 2020-03-07 DIAGNOSIS — Z23 Encounter for immunization: Secondary | ICD-10-CM | POA: Diagnosis not present

## 2020-06-03 ENCOUNTER — Ambulatory Visit: Payer: BC Managed Care – PPO | Admitting: Hematology and Oncology

## 2020-06-03 ENCOUNTER — Inpatient Hospital Stay: Payer: BC Managed Care – PPO

## 2020-10-02 ENCOUNTER — Encounter: Payer: Self-pay | Admitting: Family

## 2020-10-02 ENCOUNTER — Telehealth: Payer: BC Managed Care – PPO | Admitting: Family

## 2020-10-02 DIAGNOSIS — U071 COVID-19: Secondary | ICD-10-CM | POA: Diagnosis not present

## 2020-10-02 MED ORDER — MOLNUPIRAVIR EUA 200MG CAPSULE: 4.0000 | ORAL_CAPSULE | Freq: Two times a day (BID) | ORAL | 0 refills | Status: AC

## 2020-10-02 MED ORDER — BENZONATATE 100 MG PO CAPS: 100.0000 mg | ORAL_CAPSULE | Freq: Three times a day (TID) | ORAL | 0 refills | Status: DC | PRN

## 2020-10-02 NOTE — Progress Notes (Signed)
Virtual Visit  Note Due to COVID-19 pandemic this visit was conducted virtually. This visit type was conducted due to national recommendations for restrictions regarding the COVID-19 Pandemic (e.g. social distancing, sheltering in place) in an effort to limit this patient's exposure and mitigate transmission in our community. All issues noted in this document were discussed and addressed.  A physical exam was not performed with this format.  I connected with Joshua Wagner on 10/02/20 at 11:45 AM  by video and verified that I am speaking with the correct person using two identifiers. Joshua Wagner is currently located at home  and no one is currently with him  during visit. The provider, Jannifer Rodney, FNP is located in their office at time of visit.  I discussed the limitations, risks, security and privacy concerns of performing an evaluation and management service by vidoe and the availability of in person appointments. I also discussed with the patient that there may be a patient responsible charge related to this service. The patient expressed understanding and agreed to proceed.   History and Present Illness:  Pt presents today with COVID. He reports his symptoms started on 09/29/20 and tested positive on 09/30/20.  Cough This is a new problem. The current episode started in the past 7 days. The problem has been unchanged. The cough is non-productive. Associated symptoms include chills, a fever, headaches, myalgias, nasal congestion, postnasal drip and a sore throat. Pertinent negatives include no ear congestion, ear pain, shortness of breath or wheezing. Nothing aggravates the symptoms. He has tried rest and OTC cough suppressant for the symptoms. The treatment provided mild relief.      Review of Systems  Constitutional: Positive for chills and fever.  HENT: Positive for postnasal drip and sore throat. Negative for ear pain.   Respiratory: Positive for cough. Negative for shortness of  breath and wheezing.   Musculoskeletal: Positive for myalgias.  Neurological: Positive for headaches.     Observations/Objective: No SOB or distress noted  Assessment and Plan: 1. COVID-19 virus detected COVID positive, rest, force fluids, tylenol as needed, Quarantine for at least 5 days and fever free, report any worsening symptoms such as increased shortness of breath, swelling, or continued high fevers.  Discussed use of condoms for 3 months  - molnupiravir EUA 200 mg CAPS; Take 4 capsules (800 mg total) by mouth 2 (two) times daily for 5 days.  Dispense: 40 capsule; Refill: 0 - MyChart COVID-19 home monitoring program; Future - benzonatate (TESSALON PERLES) 100 MG capsule; Take 1 capsule (100 mg total) by mouth 3 (three) times daily as needed.  Dispense: 20 capsule; Refill: 0     I discussed the assessment and treatment plan with the patient. The patient was provided an opportunity to ask questions and all were answered. The patient agreed with the plan and demonstrated an understanding of the instructions.   The patient was advised to call back or seek an in-person evaluation if the symptoms worsen or if the condition fails to improve as anticipated.  The above assessment and management plan was discussed with the patient. The patient verbalized understanding of and has agreed to the management plan. Patient is aware to call the clinic if symptoms persist or worsen. Patient is aware when to return to the clinic for a follow-up visit. Patient educated on when it is appropriate to go to the emergency department.   Time call ended:  11:56 AM   I provided 11 minutes of  face-to-face time during this  encounter.    Evelina Dun, FNP

## 2021-03-19 ENCOUNTER — Telehealth: Payer: Self-pay

## 2021-03-19 NOTE — Telephone Encounter (Signed)
Patient called in stating that she is traveling out the country 12[place]20, wanting to know if it is okay to receive the meningitis and TDAP vaccine together. Also Fabian is asking if there is any other recommended vaccinations he needs.

## 2021-03-22 NOTE — Telephone Encounter (Signed)
Spoke with patient, gave a verbal understanding °

## 2021-03-22 NOTE — Telephone Encounter (Signed)
Please advise 

## 2021-03-23 ENCOUNTER — Ambulatory Visit: Payer: BC Managed Care – PPO

## 2021-03-24 DIAGNOSIS — Z23 Encounter for immunization: Secondary | ICD-10-CM | POA: Diagnosis not present

## 2021-04-14 ENCOUNTER — Ambulatory Visit: Payer: BC Managed Care – PPO | Admitting: Physician Assistant

## 2022-01-24 ENCOUNTER — Encounter: Payer: Self-pay | Admitting: *Deleted

## 2022-04-01 ENCOUNTER — Emergency Department (HOSPITAL_COMMUNITY): Payer: BC Managed Care – PPO

## 2022-04-01 ENCOUNTER — Emergency Department (HOSPITAL_COMMUNITY)
Admission: EM | Admit: 2022-04-01 | Discharge: 2022-04-01 | Disposition: A | Discharge status: Home / Self Care | Payer: BC Managed Care – PPO | Attending: Emergency Medicine | Admitting: Emergency Medicine

## 2022-04-01 ENCOUNTER — Encounter (HOSPITAL_COMMUNITY): Payer: Self-pay | Admitting: Emergency Medicine

## 2022-04-01 DIAGNOSIS — M79661 Pain in right lower leg: Secondary | ICD-10-CM | POA: Diagnosis not present

## 2022-04-01 DIAGNOSIS — X501XXA Overexertion from prolonged static or awkward postures, initial encounter: Secondary | ICD-10-CM | POA: Insufficient documentation

## 2022-04-01 DIAGNOSIS — S8991XA Unspecified injury of right lower leg, initial encounter: Secondary | ICD-10-CM | POA: Diagnosis not present

## 2022-04-01 DIAGNOSIS — R030 Elevated blood-pressure reading, without diagnosis of hypertension: Secondary | ICD-10-CM | POA: Insufficient documentation

## 2022-04-01 DIAGNOSIS — I10 Essential (primary) hypertension: Secondary | ICD-10-CM | POA: Diagnosis not present

## 2022-04-01 MED ORDER — OXYCODONE-ACETAMINOPHEN 5-325 MG PO TABS
2.0000 | ORAL_TABLET | Freq: Once | ORAL | Status: DC
  Filled 2022-04-01: qty 2

## 2022-04-01 MED ORDER — ACETAMINOPHEN 500 MG PO TABS
1000.0000 mg | ORAL_TABLET | Freq: Once | ORAL | Status: AC
  Administered 2022-04-01: 1000 mg via ORAL
  Filled 2022-04-01: qty 2

## 2022-04-01 NOTE — ED Triage Notes (Signed)
PT states he woke last night and felt intense pain in R calf. He states he has had difficulty walking and bearing weight.

## 2022-04-01 NOTE — ED Notes (Signed)
Pt pushed in wheelchair to car with crutches for home use. Pt is understanding of teaching and discharge information. Pt discharged to home with wife

## 2022-04-01 NOTE — Discharge Instructions (Signed)
The orthopedic office has an urgent care which has walk-in appointments, or you can call for an appointment.   You can apply ice.  Use the crutches and carry your leg.  Don't put pressure on the leg or the splint.  Your blood pressure was high.  You need to have this rechecked by your doctor.

## 2022-04-01 NOTE — ED Provider Notes (Signed)
[facility]EMERGENCY DEPT [facility] Emergency Department Provider Note MRN:  [id number]  Arrival date & time: 04/01/22     Chief Complaint   Leg Injury   History of Present Illness   Joshua Wagner is a 51 y.o. year-old male presents to the ED with chief complaint of right calf pain.  States that he felt a strain in his right calf and rolled over in bed and felt a pop.  States that he has been unable to ambulate since.  Complains of calf pain.  Denies treatment PTA.  Hx of ITP.  History provided by patient.   Review of Systems  Pertinent positive and negative review of systems noted in HPI.    Physical Exam   Vitals:   04/01/22 0341  BP: (!) 205/117  Pulse: 75  Resp: 16  Temp: 98.3 F (36.8 C)  SpO2: 96%    CONSTITUTIONAL:  well-appearing, NAD NEURO:  Alert and oriented x 3, CN 3-12 grossly intact EYES:  eyes equal and reactive ENT/NECK:  Supple, no stridor  CARDIO:  appears well-perfused  PULM:  No respiratory distress,  GI/GU:  non-distended,  MSK/SPINE:  No gross deformities, TTP of right calf, diminished plantar flexion with calf squeeze concerning for positive thompson test  SKIN:  no rash, atraumatic   *Additional and/or pertinent findings included in MDM below  Diagnostic and Interventional Summary    EKG Interpretation  Date/Time:    Ventricular Rate:    PR Interval:    QRS Duration:   QT Interval:    QTC Calculation:   R Axis:     Text Interpretation:         Labs Reviewed - No data to display  MR TIBIA FIBULA RIGHT WO CONTRAST    (Results Pending)    Medications  acetaminophen (TYLENOL) tablet 1,000 mg (1,000 mg Oral Given 04/01/22 0416)     Procedures  /  Critical Care Procedures  ED Course and Medical Decision Making  I have reviewed the triage vital signs, the nursing notes, and pertinent available records from the EMR.  Social Determinants Affecting Complexity of Care: Patient has no clinically significant social determinants  affecting this chief complaint..   ED Course:    Medical Decision Making Patient here with right calf pain.  Concern for gastroc or achilles tear.    Discussed with Dr. Blinda Leatherwood, who recommends MRI.  Amount and/or Complexity of Data Reviewed Radiology: ordered and independent interpretation performed.    Details: Prelim read of medial gastroc strain.  No achilles tendon rupture.  Risk OTC drugs.     Consultants: No consultations were needed in caring for this patient.   Treatment and Plan: Emergency department workup does not suggest an emergent condition requiring admission or immediate intervention beyond  what has been performed at this time. The patient is safe for discharge and has  been instructed to return immediately for worsening symptoms, change in  symptoms or any other concerns    Final Clinical Impressions(s) / ED Diagnoses     ICD-10-CM   1. Right calf pain  M79.661     2. Elevated blood pressure reading  R03.0       ED Discharge Orders     None         Discharge Instructions Discussed with and Provided to Patient:     Discharge Instructions      The orthopedic office has an urgent care which has walk-in appointments, or you can call for an appointment.  You can apply ice.  Use the crutches and carry your leg.  Don't put pressure on the leg or the splint.  Your blood pressure was high.  You need to have this rechecked by your doctor.       Roxy Horseman, PA-C 04/01/22 8588    Gilda Crease, MD 04/01/22 5027    Gilda Crease, MD 04/01/22 [phone]

## 2022-04-01 NOTE — Progress Notes (Signed)
Orthopedic Tech Progress Note Patient Details:  Joshua Wagner 07/06/1970 [id number]  Ortho Devices Type of Ortho Device: Crutches, Post (short leg) splint Ortho Device/Splint Location: rle Ortho Device/Splint Interventions: Ordered, Application, Adjustment   Post Interventions Patient Tolerated: Well Instructions Provided: Care of device, Adjustment of device  [name] 04/01/2022, 6:53 AM

## 2022-04-03 ENCOUNTER — Emergency Department (HOSPITAL_BASED_OUTPATIENT_CLINIC_OR_DEPARTMENT_OTHER)
Admission: EM | Admit: 2022-04-03 | Discharge: 2022-04-03 | Disposition: A | Discharge status: Home / Self Care | Payer: BC Managed Care – PPO | Attending: Emergency Medicine | Admitting: Emergency Medicine

## 2022-04-03 ENCOUNTER — Other Ambulatory Visit: Payer: Self-pay

## 2022-04-03 DIAGNOSIS — R7989 Other specified abnormal findings of blood chemistry: Secondary | ICD-10-CM

## 2022-04-03 DIAGNOSIS — R001 Bradycardia, unspecified: Secondary | ICD-10-CM | POA: Diagnosis not present

## 2022-04-03 DIAGNOSIS — G4489 Other headache syndrome: Secondary | ICD-10-CM | POA: Diagnosis not present

## 2022-04-03 DIAGNOSIS — R944 Abnormal results of kidney function studies: Secondary | ICD-10-CM | POA: Insufficient documentation

## 2022-04-03 DIAGNOSIS — I10 Essential (primary) hypertension: Secondary | ICD-10-CM | POA: Diagnosis not present

## 2022-04-03 DIAGNOSIS — Z79899 Other long term (current) drug therapy: Secondary | ICD-10-CM | POA: Insufficient documentation

## 2022-04-03 DIAGNOSIS — R030 Elevated blood-pressure reading, without diagnosis of hypertension: Secondary | ICD-10-CM | POA: Diagnosis not present

## 2022-04-03 DIAGNOSIS — Z91040 Latex allergy status: Secondary | ICD-10-CM | POA: Diagnosis not present

## 2022-04-03 DIAGNOSIS — R0689 Other abnormalities of breathing: Secondary | ICD-10-CM | POA: Diagnosis not present

## 2022-04-03 LAB — CBC AND DIFFERENTIAL
Hemoglobin: 15 (ref 13.5–17.5)
WBC: 7

## 2022-04-03 LAB — CBC: RBC: 5.44 — AB (ref 3.87–5.11)

## 2022-04-03 LAB — BASIC METABOLIC PANEL
Creatinine: 1.9 — AB (ref 0.6–1.3)
Glucose: 99
Potassium: 4.5 mEq/L (ref 3.5–5.1)
Sodium: 143 (ref 137–147)

## 2022-04-03 MED ORDER — AMLODIPINE BESYLATE 5 MG PO TABS: 5.0000 mg | ORAL_TABLET | Freq: Every day | ORAL | 0 refills | Status: DC

## 2022-04-03 MED ORDER — BENAZEPRIL HCL 10 MG PO TABS: 40.0000 mg | ORAL_TABLET | Freq: Every day | ORAL | 0 refills | Status: DC

## 2022-04-03 NOTE — ED Provider Notes (Signed)
[facility] EMERGENCY DEPT Provider Note   CSN: [id number] Arrival date & time: 04/03/22  1204     History  Chief Complaint  Patient presents with   Hypertension    Joshua Wagner is a 51 y.o. male.  HPI 51 year old male presents today complaining of high blood pressure.  He states that he has had some mild blood pressure elevation over the past several years but had not been treating with for it.  He is not actively seen a physician regularly.  He was seen for an injury to his lower extremities last week and the [facility] emergency department.  He was told he had high blood pressure in them but it could have been related to the pain from his injury.  He followed up today at urgent care and was noted to have blood pressure systolically in the 180s with diastolic in the 120s.  He was given a dose of clonidine.  He was sent to the ED for further evaluation.  He had labs obtained there that showed a creatinine of 1.9.  He has not had any associated chest pain, shortness of breath, or current headache.  He describes some intermittent headaches in the past.    Home Medications Prior to Admission medications   Medication Sig Start Date End Date Taking? Authorizing Provider  amLODipine (NORVASC) 5 MG tablet Take 1 tablet (5 mg total) by mouth daily. 04/03/22  Yes Margarita Grizzle, MD  benazepril (LOTENSIN) 10 MG tablet Take 4 tablets (40 mg total) by mouth daily. 04/03/22  Yes Margarita Grizzle, MD  Multiple Vitamin (MULTIVITAMIN) tablet Take 1 tablet by mouth daily.   Yes [provider]  benzonatate (TESSALON PERLES) 100 MG capsule Take 1 capsule (100 mg total) by mouth 3 (three) times daily as needed. 10/02/20   Junie Spencer, FNP      Allergies    Bactrim [sulfamethoxazole-trimethoprim], Penicillins, Sulfa antibiotics, and Latex    Review of Systems   Review of Systems  Physical Exam Updated Vital Signs BP (!) 146/108 (BP Location: Right Arm)   Pulse 69   Temp 98.2  F (36.8 C) (Oral)   Resp 18   SpO2 97%  Physical Exam Vitals and nursing note reviewed.  Constitutional:      Appearance: Normal appearance.  HENT:     Head: Normocephalic.     Right Ear: External ear normal.     Left Ear: External ear normal.     Nose: Nose normal.     Mouth/Throat:     Pharynx: Oropharynx is clear.  Eyes:     Extraocular Movements: Extraocular movements intact.     Pupils: Pupils are equal, round, and reactive to light.  Cardiovascular:     Rate and Rhythm: Normal rate and regular rhythm.     Pulses: Normal pulses.  Pulmonary:     Effort: Pulmonary effort is normal.  Abdominal:     General: Abdomen is flat. Bowel sounds are normal.  Musculoskeletal:        General: Normal range of motion.     Cervical back: Normal range of motion.  Skin:    General: Skin is warm and dry.     Capillary Refill: Capillary refill takes less than 2 seconds.  Neurological:     General: No focal deficit present.     Mental Status: He is alert.  Psychiatric:        Mood and Affect: Mood normal.     ED Results / Procedures /  Treatments   Labs (all labs ordered are listed, but only abnormal results are displayed) Labs Reviewed - No data to display  EKG None  Radiology No results found.  Procedures Procedures    Medications Ordered in ED Medications - No data to display  ED Course/ Medical Decision Making/ A&P                           Medical Decision Making 51 year old male presents today complaining of hypertension.  Unclear duration of time.  Here his blood pressure is 143/110.  He received clonidine at the urgent care.  Discussed with him that we do not wish to lower his blood pressure any more acutely. I did review his labs that showed a creatinine at 1.9.  I do not know what his baseline is and unable to find any old creatinines in the system. Plan start benazepril 40 mg/day as well as amlodipine. Patient is advised of need for close follow-up and voices  understanding.  We have also discussed return precautions.           Final Clinical Impression(s) / ED Diagnoses Final diagnoses:  Hypertension, uncontrolled  Elevated serum creatinine    Rx / DC Orders ED Discharge Orders          Ordered    benazepril (LOTENSIN) 10 MG tablet  Daily        04/03/22 1454    amLODipine (NORVASC) 5 MG tablet  Daily        04/03/22 1454              Margarita Grizzle, MD 04/03/22 1455

## 2022-04-03 NOTE — ED Notes (Signed)
Pt requested to be evaluated for hypertension. Pt has no complaints associated with his hypertension.

## 2022-04-03 NOTE — Discharge Instructions (Signed)
Please take prescriptions as prescribed Call your doctors office tomorrow for recheck this week.

## 2022-04-03 NOTE — ED Triage Notes (Signed)
In for eval of headache and high blood pressure. Was seen at urgent care today, bp 187/125, blood work was collected and clonidine 0.1 mg po. Denies blurred vision or tingling or numbness to extremities.

## 2022-04-05 ENCOUNTER — Ambulatory Visit: Payer: BC Managed Care – PPO | Admitting: Nurse Practitioner

## 2022-04-05 ENCOUNTER — Encounter: Payer: Self-pay | Admitting: Nurse Practitioner

## 2022-04-05 ENCOUNTER — Encounter: Payer: Self-pay | Admitting: *Deleted

## 2022-04-05 VITALS — BP 148/94 | HR 86 | Temp 98.6°F | Ht 71.0 in | Wt 192.0 lb

## 2022-04-05 DIAGNOSIS — Z131 Encounter for screening for diabetes mellitus: Secondary | ICD-10-CM | POA: Diagnosis not present

## 2022-04-05 DIAGNOSIS — D693 Immune thrombocytopenic purpura: Secondary | ICD-10-CM | POA: Diagnosis not present

## 2022-04-05 DIAGNOSIS — Z1211 Encounter for screening for malignant neoplasm of colon: Secondary | ICD-10-CM

## 2022-04-05 DIAGNOSIS — I10 Essential (primary) hypertension: Secondary | ICD-10-CM | POA: Diagnosis not present

## 2022-04-05 DIAGNOSIS — M79604 Pain in right leg: Secondary | ICD-10-CM

## 2022-04-05 DIAGNOSIS — Z789 Other specified health status: Secondary | ICD-10-CM

## 2022-04-05 DIAGNOSIS — R809 Proteinuria, unspecified: Secondary | ICD-10-CM | POA: Insufficient documentation

## 2022-04-05 LAB — POCT URINALYSIS DIPSTICK
Bilirubin, UA: NEGATIVE
Glucose, UA: NEGATIVE
Ketones, UA: NEGATIVE
Leukocytes, UA: NEGATIVE
Nitrite, UA: NEGATIVE
Protein, UA: POSITIVE — AB
Spec Grav, UA: 1.02 (ref 1.010–1.025)
Urobilinogen, UA: 0.2 E.U./dL
pH, UA: 5 (ref 5.0–8.0)

## 2022-04-05 LAB — POCT GLYCOSYLATED HEMOGLOBIN (HGB A1C): Hemoglobin A1C: 5.1 % (ref 4.0–5.6)

## 2022-04-05 NOTE — Assessment & Plan Note (Signed)
Platelet count 95 hemoglobin 15.0, RBC 5.44 Patient denies abnormal bleeding He was encouraged to follow-up with hematology since he is due for follow-up

## 2022-04-05 NOTE — Assessment & Plan Note (Signed)
A1C- 5.1 

## 2022-04-05 NOTE — Progress Notes (Signed)
New Patient Office Visit  Subjective:  Patient ID: Joshua Wagner, male    DOB: 11-28-1970  Age: 51 y.o. MRN: [id number]  CC:  Chief Complaint  Patient presents with   New Patient (Initial Visit)    Pt stated--went to ER 04/03/22 for hypertension---pt stated, light headache , chest pain since had the injury,.    HPI Joshua Wagner is a 51 y.o. male with past medical history of hypertension, multiple lung nodules, thrombocytopenia, nephrolithiasis, splenic lesion, presents to establish care for his chronic medical conditions.  Hypertension.  Patient stated that he went to the emergency room 2 days ago due to high blood pressure, he was started on amlodipine 5 mg daily, benazepril 40 mg daily.  He has been taking both medications for the past 2 days blood pressure readings at home has been in the low 40s to 90s.  States that he sometimes has headaches sometimes has burning sensation in his chest.  He denies dizziness, edema, syncope.   Idiopathic thrombocytopenia, currently established with hematology last visit was in 2021 there was a plan to follow-up in 1 year patient currently denies blood in the urine, bloody stool, abnormal bleeding.  He has been avoiding use of NSAIDs.  Most recent platelet count was 95.    Patient wearing a boot on his right leg he had presented to the emergency room on 04/01/2022 for complaints of right calf pain that started after waking up that day .  His calf pain was told to be due to Achilles tear or gastroc he has followed up with [facility] as planned .  He currently denies pain .    Patient stated that he is up-to-date with Tdap and flu vaccine records requested from his pharmacy today.  He was encouraged to get shingles vaccine at the pharmacy.   Cologuard ordered for colon cancer screening.      Past Medical History:  Diagnosis Date   Hyperbilirubinemia    Multiple lung nodules 08/11/2016   Nephrolithiasis 08/11/2016   Sepsis (HCC)    Splenic  lesion 05/03/2016   Splenomegaly 05/03/2016   Thrombocytopenia, idiopathic (HCC)     Past Surgical History:  Procedure Laterality Date   TEE WITHOUT CARDIOVERSION N/A 03/08/2016   Procedure: TRANSESOPHAGEAL ECHOCARDIOGRAM (TEE);  Surgeon: Quintella Reichert, MD;  Location: [facility] ENDOSCOPY;  Service: Cardiovascular;  Laterality: N/A;    Family History  Problem Relation Age of Onset   Diabetes Mother    Diabetes Father    Diabetes Maternal Grandfather    Heart disease Maternal Grandfather    Diabetes Brother     Social History   Socioeconomic History   Marital status: Married    Spouse name: Not on file   Number of children: 2   Years of education: Not on file   Highest education level: Not on file  Occupational History   Occupation: Dentist  Tobacco Use   Smoking status: Never   Smokeless tobacco: Never  Vaping Use   Vaping Use: Never used  Substance and Sexual Activity   Alcohol use: No   Drug use: No   Sexual activity: Yes    Partners: Female  Other Topics Concern   Not on file  Social History Narrative   Not on file   Social Determinants of Health   Financial Resource Strain: Not on file  Food Insecurity: Not on file  Transportation Needs: Not on file  Physical Activity: Not on file  Stress: Not on file  Social Connections: Not  on file  Intimate Partner Violence: Not on file    ROS Review of Systems  Constitutional:  Negative for activity change, appetite change, chills, diaphoresis, fatigue and fever.  Respiratory:  Negative for chest tightness, shortness of breath and wheezing.   Cardiovascular:  Positive for chest pain. Negative for palpitations and leg swelling.  Gastrointestinal:  Negative for abdominal distention, abdominal pain, anal bleeding and blood in stool.  Musculoskeletal:  Positive for arthralgias.  Neurological:  Positive for headaches. Negative for dizziness, seizures, facial asymmetry, speech difficulty, light-headedness and numbness.   Psychiatric/Behavioral:  Negative for agitation, behavioral problems, self-injury, sleep disturbance and suicidal ideas.     Objective:   Today's Vitals: BP (!) 148/94   Pulse 86   Temp 98.6 F (37 C)   Ht 5\' 11"  (1.803 m)   Wt 192 lb (87.1 kg)   SpO2 98%   BMI 26.78 kg/m   Physical Exam Constitutional:      General: He is not in acute distress.    Appearance: Normal appearance. He is not ill-appearing, toxic-appearing or diaphoretic.  Eyes:     General: No scleral icterus.       Right eye: No discharge.        Left eye: No discharge.     Extraocular Movements: Extraocular movements intact.     Conjunctiva/sclera: Conjunctivae normal.  Cardiovascular:     Rate and Rhythm: Normal rate and regular rhythm.     Pulses: Normal pulses.     Heart sounds: Normal heart sounds. No murmur heard.    No friction rub. No gallop.  Pulmonary:     Effort: Pulmonary effort is normal. No respiratory distress.     Breath sounds: Normal breath sounds. No stridor. No wheezing, rhonchi or rales.  Chest:     Chest wall: No tenderness.  Abdominal:     General: There is no distension.     Palpations: Abdomen is soft.     Tenderness: There is no abdominal tenderness.  Musculoskeletal:        General: Signs of injury present.     Left lower leg: No edema.     Comments: Wearing Ortho boot on right leg, left leg without swelling has palpable pedal pulse skin warm and dry.  Able to ambulate independently  Skin:    General: Skin is warm and dry.     Capillary Refill: Capillary refill takes less than 2 seconds.  Neurological:     Mental Status: He is alert and oriented to person, place, and time.     Gait: Gait abnormal.     Comments: Wearing otho boot on right leg  Psychiatric:        Mood and Affect: Mood normal.        Behavior: Behavior normal.        Thought Content: Thought content normal.        Judgment: Judgment normal.     Assessment & Plan:   Problem List Items Addressed This  Visit       Cardiovascular and Mediastinum   High blood pressure - Primary    BP Readings from Last 3 Encounters:  04/05/22 (!) 148/94  04/03/22 (!) 150/106  04/01/22 (!) 196/120  Currently on amlodipine 5 mg daily, benazepril 40 mg daily Continue current medications DASH diet advised patient encouraged to engage in regular moderate exercises at least 150 minutes weekly Follow-up in 2 weeks with a nurse to recheck blood pressure with labs Follow-up in the office in  4 weeks Continue to monitor blood pressure at home BP goal is less than 140/90 EKG done today shows normal sinus rhythm rate of 88 minimal voltage criteria for left ventricular hypertrophy.  I have no concern for ischemia.       Relevant Orders   EKG 12-Lead   Basic Metabolic Panel     Hematopoietic and Hemostatic   Thrombocytopenia, idiopathic (HCC)    Platelet count 95 hemoglobin 15.0, RBC 5.44 Patient denies abnormal bleeding He was encouraged to follow-up with hematology since he is due for follow-up        Other   Right leg pain    Findings suggestive of myofascial tear involving the medial head gastrocnemius, with extensive intramuscular edema and a crescentic hematoma along the outer surface measuring 2.9 x 8.7 cm in the axial dimension and 20.4 cm in craniocaudal extent. Minimal reactive intramuscular edema in the soleus muscle. Intact Achilles tendon. No acute osseous abnormality.  Patient has followed up with orthopedics as planned, currently denies pain he is wearing a otho boot today.       Screening for diabetes mellitus    A1C 5.1      Relevant Orders   POCT glycosylated hemoglobin (Hb A1C) (Completed)   Other Visit Diagnoses     Screening for colon cancer       Relevant Orders   Cologuard   Health maintenance alteration       Relevant Orders   POCT urinalysis dipstick (Completed)       Outpatient Encounter Medications as of 04/05/2022  Medication Sig   amLODipine (NORVASC) 5 MG  tablet Take 1 tablet (5 mg total) by mouth daily.   benazepril (LOTENSIN) 10 MG tablet Take 4 tablets (40 mg total) by mouth daily.   Multiple Vitamin (MULTIVITAMIN) tablet Take 1 tablet by mouth daily.   [DISCONTINUED] benzonatate (TESSALON PERLES) 100 MG capsule Take 1 capsule (100 mg total) by mouth 3 (three) times daily as needed.   No facility-administered encounter medications on file as of 04/05/2022.    Follow-up: Return in about 4 weeks (around 05/03/2022) for CPE.   Donell Beers, FNP

## 2022-04-05 NOTE — Patient Instructions (Signed)
1. Hypertension, unspecified type Please continue amlodipine 5 mg daily, benazepril 40 mg daily avoid salty foods exercise regularly at least 20 to 50 minutes weekly.  Follow-up in the office in 2 weeks for labs and to recheck your blood pressure  Around 3 times per week, check your blood pressure 2 times per day. once in the morning and once in the evening. The readings should be at least one minute apart. Write down these values and bring them to your next nurse visit/appointment.  When you check your BP, make sure you have been doing something calm/relaxing 5 minutes prior to checking. Both feet should be flat on the floor and you should be sitting. Use your left arm and make sure it is in a relaxed position (on a table), and that the cuff is at the approximate level/height of your heart.   Please get your shingles vaccine at the pharmacy as discussed Follow-up with hematology as discussed.   It is important that you exercise regularly at least 30 minutes 5 times a week as tolerated  Think about what you will eat, plan ahead. Choose " clean, green, fresh or frozen" over canned, processed or packaged foods which are more sugary, salty and fatty. 70 to 75% of food eaten should be vegetables and fruit. Three meals at set times with snacks allowed between meals, but they must be fruit or vegetables. Aim to eat over a 12 hour period , example 7 am to 7 pm, and STOP after  your last meal of the day. Drink water,generally about 64 ounces per day, no other drink is as healthy. Fruit juice is best enjoyed in a healthy way, by EATING the fruit.  Thanks for choosing [facility] we consider it a privelige to serve you.

## 2022-04-05 NOTE — Assessment & Plan Note (Addendum)
BP Readings from Last 3 Encounters:  04/05/22 (!) 148/94  04/03/22 (!) 150/106  04/01/22 (!) 196/120  Currently on amlodipine 5 mg daily, benazepril 40 mg daily Continue current medications DASH diet advised patient encouraged to engage in regular moderate exercises at least 150 minutes weekly Follow-up in 2 weeks with a nurse to recheck blood pressure with labs Follow-up in the office in 4 weeks Continue to monitor blood pressure at home BP goal is less than 140/90 EKG done today shows normal sinus rhythm rate of 88 minimal voltage criteria for left ventricular hypertrophy.  I have no concern for ischemia.

## 2022-04-05 NOTE — Assessment & Plan Note (Signed)
Protein noted in the urine today. Will recheck labs at next visit and refer to nephrology if needed. Currently on benazepril 40mg  daily.

## 2022-04-05 NOTE — Assessment & Plan Note (Signed)
Findings suggestive of myofascial tear involving the medial head gastrocnemius, with extensive intramuscular edema and a crescentic hematoma along the outer surface measuring 2.9 x 8.7 cm in the axial dimension and 20.4 cm in craniocaudal extent. Minimal reactive intramuscular edema in the soleus muscle. Intact Achilles tendon. No acute osseous abnormality.  Patient has followed up with orthopedics as planned, currently denies pain he is wearing a otho boot today.

## 2022-04-07 DIAGNOSIS — M79661 Pain in right lower leg: Secondary | ICD-10-CM | POA: Diagnosis not present

## 2022-04-13 ENCOUNTER — Encounter: Payer: Self-pay | Admitting: Internal Medicine

## 2022-04-13 ENCOUNTER — Ambulatory Visit: Payer: BC Managed Care – PPO | Admitting: Internal Medicine

## 2022-04-13 VITALS — BP 111/75 | HR 74 | Temp 98.0°F | Resp 12 | Ht 71.0 in | Wt 189.0 lb

## 2022-04-13 DIAGNOSIS — R0683 Snoring: Secondary | ICD-10-CM

## 2022-04-13 DIAGNOSIS — N183 Chronic kidney disease, stage 3 unspecified: Secondary | ICD-10-CM

## 2022-04-13 DIAGNOSIS — I129 Hypertensive chronic kidney disease with stage 1 through stage 4 chronic kidney disease, or unspecified chronic kidney disease: Secondary | ICD-10-CM | POA: Diagnosis not present

## 2022-04-13 DIAGNOSIS — Z0000 Encounter for general adult medical examination without abnormal findings: Secondary | ICD-10-CM

## 2022-04-13 DIAGNOSIS — Z119 Encounter for screening for infectious and parasitic diseases, unspecified: Secondary | ICD-10-CM | POA: Diagnosis not present

## 2022-04-13 DIAGNOSIS — R0789 Other chest pain: Secondary | ICD-10-CM

## 2022-04-13 DIAGNOSIS — D693 Immune thrombocytopenic purpura: Secondary | ICD-10-CM

## 2022-04-13 DIAGNOSIS — I10 Essential (primary) hypertension: Secondary | ICD-10-CM | POA: Diagnosis not present

## 2022-04-13 DIAGNOSIS — E785 Hyperlipidemia, unspecified: Secondary | ICD-10-CM

## 2022-04-13 DIAGNOSIS — R809 Proteinuria, unspecified: Secondary | ICD-10-CM | POA: Diagnosis not present

## 2022-04-13 DIAGNOSIS — R918 Other nonspecific abnormal finding of lung field: Secondary | ICD-10-CM

## 2022-04-13 DIAGNOSIS — S86811D Strain of other muscle(s) and tendon(s) at lower leg level, right leg, subsequent encounter: Secondary | ICD-10-CM

## 2022-04-13 DIAGNOSIS — S86819A Strain of other muscle(s) and tendon(s) at lower leg level, unspecified leg, initial encounter: Secondary | ICD-10-CM | POA: Insufficient documentation

## 2022-04-13 HISTORY — DX: Other chest pain: R07.89

## 2022-04-13 LAB — URINALYSIS, ROUTINE W REFLEX MICROSCOPIC
Bilirubin Urine: NEGATIVE
Hgb urine dipstick: NEGATIVE
Ketones, ur: NEGATIVE
Leukocytes,Ua: NEGATIVE
Nitrite: NEGATIVE
RBC / HPF: NONE SEEN (ref 0–?)
Specific Gravity, Urine: 1.015 (ref 1.000–1.030)
Urine Glucose: NEGATIVE
Urobilinogen, UA: 0.2 (ref 0.0–1.0)
pH: 5.5 (ref 5.0–8.0)

## 2022-04-13 LAB — CBC WITH DIFFERENTIAL/PLATELET
Basophils Absolute: 0 10*3/uL (ref 0.0–0.1)
Basophils Relative: 0.5 % (ref 0.0–3.0)
Eosinophils Absolute: 0.1 10*3/uL (ref 0.0–0.7)
Eosinophils Relative: 1.4 % (ref 0.0–5.0)
HCT: 44.7 % (ref 39.0–52.0)
Hemoglobin: 15.4 g/dL (ref 13.0–17.0)
Lymphocytes Relative: 24.3 % (ref 12.0–46.0)
Lymphs Abs: 1.5 10*3/uL (ref 0.7–4.0)
MCHC: 34.4 g/dL (ref 30.0–36.0)
MCV: 78.7 fl (ref 78.0–100.0)
Monocytes Absolute: 0.6 10*3/uL (ref 0.1–1.0)
Monocytes Relative: 9.1 % (ref 3.0–12.0)
Neutro Abs: 4.1 10*3/uL (ref 1.4–7.7)
Neutrophils Relative %: 64.7 % (ref 43.0–77.0)
Platelets: 120 10*3/uL — ABNORMAL LOW (ref 150.0–400.0)
RBC: 5.68 Mil/uL (ref 4.22–5.81)
RDW: 14.1 % (ref 11.5–15.5)
WBC: 6.3 10*3/uL (ref 4.0–10.5)

## 2022-04-13 MED ORDER — AMLODIPINE BESYLATE 5 MG PO TABS: 5.0000 mg | ORAL_TABLET | Freq: Every day | ORAL | 3 refills | Status: DC

## 2022-04-13 MED ORDER — BENAZEPRIL HCL 10 MG PO TABS: 40.0000 mg | ORAL_TABLET | Freq: Every day | ORAL | 3 refills | Status: DC

## 2022-04-13 NOTE — Assessment & Plan Note (Signed)
Individualized Hypertension Management: Stable, well controlled at  BP Readings from Last 1 Encounters:  04/13/22 111/75    Goal blood pressure of less than 130/80 discussed and explained Resistant/secondary hypertension workup: not necessary in my opinion Current hypertension medications:       Sig   amLODipine (NORVASC) 5 MG tablet (Taking) Take 1 tablet (5 mg total) by mouth daily.   benazepril (LOTENSIN) 10 MG tablet (Taking) Take 4 tablets (40 mg total) by mouth daily.      Adjust medications as follows:  No change  Standardized hypertension counseling and management: Counseled him to limit: salt, alcohol, NSAIDS, excess body weight. Have explained risks of poor control are FUTURE stroke and heart attacks Encouragement for home blood pressure monitoringSee AFTER VISIT SUMMARY for addition educational information provided Offered to refill meds.

## 2022-04-13 NOTE — Addendum Note (Signed)
Addended by: Laddie Aquas A on: 04/13/2022 04:21 PM   Modules accepted: Orders

## 2022-04-13 NOTE — Assessment & Plan Note (Addendum)
He declined referral to lung specialist  He said he discussed in past with hematology / oncology specialist(s) and there was no plan to further follow up He agreed to see hematology / oncology specialist(s) again, but at a closer loc to discuss further.

## 2022-04-13 NOTE — Assessment & Plan Note (Addendum)
I offered cardiology and coronary CT- we are going to do the coronary CT first after shared decision making and he will see cardiology if it shows anything.  I am also going to check the cholesterol and encouraged a heart healthy diet and aggressive blood blood pressure goals of 130/80.  Advised ER if chest pain with high blood pressure.  Advised heart healthy diet.  Doesn't sound like cardiac chest pain though.

## 2022-04-13 NOTE — Patient Instructions (Addendum)
It was a pleasure seeing you today!  Your health and satisfaction are my top priorities. If you believe your experience today was worthy of a 5-star rating, I'd be grateful for your feedback! Loralee Pacas, MD   CHECKOUT CHECKLIST  _0    Schedule next appointment(s):    6 months Any requested lab visits should be scheduled as appointments too  If you are not doing well:  Return to the office sooner Please bring all your medicine bottles to each appointment If your condition begins to worsen or become severe:  go to the emergency room or even call 911  _1    Sign release of information authorizations: Any records we need for your care and to be your medical home  REMINDERS:  _2    Use my chart to check your lab results and keep checking your blood pressure   _3    They will call you about all the referrals and the coronary calcium score.  _4   (Optional):  Review your clinical notes on MyChart after they are completed.     Today's draft of the physician documented plan for today's visit: (final revisions will be visible on MyChart chart later) Hypertension, unspecified type Assessment & Plan: Individualized Hypertension Management: Stable, well controlled at  BP Readings from Last 1 Encounters:  04/13/22 111/75    Goal blood pressure of less than 130/80 discussed and explained Resistant/secondary hypertension workup: not necessary in my opinion Current hypertension medications:       Sig   amLODipine (NORVASC) 5 MG tablet (Taking) Take 1 tablet (5 mg total) by mouth daily.   benazepril (LOTENSIN) 10 MG tablet (Taking) Take 4 tablets (40 mg total) by mouth daily.      Adjust medications as follows:  No change  Standardized hypertension counseling and management: Counseled him to limit: salt, alcohol, NSAIDS, excess body weight. Have explained risks of poor control are FUTURE stroke and heart attacks Encouragement for home blood pressure monitoringSee AFTER VISIT SUMMARY for  addition educational information provided Offered to refill meds.    Orders: -     CBC with Differential/Platelet -     Lipid panel -     amLODIPine Besylate; Take 1 tablet (5 mg total) by mouth daily.  Dispense: 90 tablet; Refill: 3 -     Benazepril HCl; Take 4 tablets (40 mg total) by mouth daily.  Dispense: 90 tablet; Refill: 3  Screening examination for infectious disease -     Hepatitis C antibody -     HIV Antibody (routine testing w rflx)  Preventative health care  Snoring -     Ambulatory referral to Neurology  Hypertensive nephropathy  Stage 3 chronic kidney disease, unspecified whether stage 3a or 3b CKD ([place]) -     CBC with Differential/Platelet -     Comprehensive metabolic panel -     Urinalysis, Routine w reflex microscopic -     Phosphorus -     Magnesium -     Uric acid -     Cystatin C with Glomerular Filtration Rate, Estimated (eGFR)  Proteinuria, unspecified type -     Lipid panel -     Urinalysis, Routine w reflex microscopic  Chest discomfort Assessment & Plan: I offered cardiology and coronary CT- we are going to do the coronary CT first after shared decision making and he will see cardiology if it shows anything.  I am also going to check the cholesterol and encouraged a heart healthy diet and aggressive  blood blood pressure goals of 130/80.  Advised ER if chest pain with high blood pressure.  Advised heart healthy diet.  Doesn't sound like cardiac chest pain though.    Thrombocytopenia, idiopathic ([place]) -     Ambulatory referral to Hematology / Oncology  Multiple lung nodules Assessment & Plan: He declined referral to lung specialist  He said he discussed in past with hematology / oncology specialist(s) and there was no plan to further follow up He agreed to see hematology / oncology specialist(s) again, but at a closer loc to discuss further.   Strain of calf muscle, right, subsequent encounter  Hyperlipidemia, acquired -     Lipid  panel  Other orders -     EKG   QUESTIONS & CONCERNS: CLINICAL: please contact us via phone [phone] OR MyChart messaging  LAB & IMAGING:   We will call you if the results are significantly abnormal or you don't use MyChart.  Most normal results will be posted to MyChart immediately and have a clinical review message by Dr. Randol Kern posted within 2-3 business days.   If you have not heard from [place] regarding the results in 2 weeks OR if you need priority reporting, please contact this office. MYCHART:  The fastest way to get your results and easiest way to stay in touch with [place] is by activating your My Chart account. Instructions are located on the last page of this paperwork.  BILLING: xray and lab orders are billed from separate companies and questions./concerns should be directed to the [facility].  For visit charges please discuss with our administrative services COMPLAINTS:  please let Dr. Randol Kern know or see the [place], by asking at the front desk: we want you to be satisfied with every experience and we would be grateful for the opportunity to address any problems

## 2022-04-13 NOTE — Assessment & Plan Note (Deleted)
Risk factors family history diabetes brother, heart attack in 74s in grandfather, snoring, bad cholesterol

## 2022-04-13 NOTE — Progress Notes (Signed)
[facility]  Phone: [phone]  New patient visit  Visit Date: 04/13/2022 Patient: Joshua Wagner   DOB: 04/01/1971   51 y.o. Male  MRN: [id number]  Today's healthcare provider: Loralee Pacas, MD  Assessment and Plan:     [place] was seen today for transfer of care, annual exam, hypertension, check kidneys and heart and red spots on skin.  Hypertension, unspecified type Overview: Was in 180's and emergency room started him on Current hypertension medications:       Sig   amLODipine (NORVASC) 5 MG tablet (Taking) Take 1 tablet (5 mg total) by mouth daily.   benazepril (LOTENSIN) 10 MG tablet (Taking) Take 4 tablets (40 mg total) by mouth daily.      Home readings were well controlled at 04/13/22 Risk factors family history diabetes brother, heart attack in 53s in grandfather, snoring, bad cholesterol  Assessment & Plan: Individualized Hypertension Management: Stable, well controlled at  BP Readings from Last 1 Encounters:  04/13/22 111/75    Goal blood pressure of less than 130/80 discussed and explained Resistant/secondary hypertension workup: not necessary in my opinion Current hypertension medications:       Sig   amLODipine (NORVASC) 5 MG tablet (Taking) Take 1 tablet (5 mg total) by mouth daily.   benazepril (LOTENSIN) 10 MG tablet (Taking) Take 4 tablets (40 mg total) by mouth daily.      Adjust medications as follows:  No change  Standardized hypertension counseling and management: Counseled him to limit: salt, alcohol, NSAIDS, excess body weight. Have explained risks of poor control are FUTURE stroke and heart attacks Encouragement for home blood pressure monitoringSee AFTER VISIT SUMMARY for addition educational information provided Offered to refill meds.    Orders: -     CBC with Differential/Platelet -     Lipid panel -     amLODIPine Besylate; Take 1 tablet (5 mg total) by mouth daily.  Dispense: 90 tablet; Refill: 3 -      Benazepril HCl; Take 4 tablets (40 mg total) by mouth daily.  Dispense: 90 tablet; Refill: 3  Screening examination for infectious disease -     Hepatitis C antibody -     HIV Antibody (routine testing w rflx)  Preventative health care  Snoring Overview:  STOP-Bang Score Do you snore loudly?: Yes Do you often feel tired, fatigued, or sleepy during the daytime?: Yes Has anyone observed you stop breathing during sleep?: No Do you have (or are you being treated for) high blood pressure?: Yes Recent BMI (Calculated): 26.37 Is BMI greater than 35 kg/m2?: 0=No Age older than 51 years old?: 0=No Has large neck size > 40 cm (15.7 in, large male shirt size, large male collar size > 16): No Gender - Male: 1=Yes STOP-Bang Total Score: 4   Orders: -     Ambulatory referral to Neurology  Hypertensive nephropathy  Stage 3 chronic kidney disease, unspecified whether stage 3a or 3b CKD (HCC) -     CBC with Differential/Platelet -     Comprehensive metabolic panel -     Urinalysis, Routine w reflex microscopic -     Phosphorus -     Magnesium -     Uric acid -     Cystatin C with Glomerular Filtration Rate, Estimated (eGFR)  Proteinuria, unspecified type -     Lipid panel -     Urinalysis, Routine w reflex microscopic  Chest discomfort Overview: History of intermittent mild chest discomfort and poor  cholesterol and uncontrolled hypertension family history of heart attack in grandfather in 12s  Assessment & Plan: I offered cardiology and coronary CT- we are going to do the coronary CT first after shared decision making and he will see cardiology if it shows anything.  I am also going to check the cholesterol and encouraged a heart healthy diet and aggressive blood blood pressure goals of 130/80.  Advised ER if chest pain with high blood pressure.  Advised heart healthy diet.  Doesn't sound like cardiac chest pain though.    Thrombocytopenia, idiopathic (HCC) Overview: CBC Latest Ref  Rng & Units 08/11/2016 03/16/2016 03/08/2016  WBC 4.0 - 10.5 K/uL 3.6(L) 3.1(L) 5.1  Hemoglobin 13.0 - 17.0 g/dL 15.1 11.9(L) 13.2  Hematocrit 39.0 - 52.0 % 43.1 35.0(L) 38.4(L)  Platelets 150.0 - 400.0 K/uL 89.0(L) 122(L) 105(L)     Orders: -     Ambulatory referral to Hematology / Oncology  Multiple lung nodules Overview: PET SCAN IMPRESSION (05/11/16): There proximally 8 small peribronchovascular nodules in the lungs, at or below 5 mm in diameter. Consider follow up CT imaging over the next 6-12 months in order to reassess.  Assessment & Plan: He declined referral to lung specialist  He said he discussed in past with hematology / oncology specialist(s) and there was no plan to further follow up He agreed to see hematology / oncology specialist(s) again, but at a closer loc to discuss further.   Strain of calf muscle, right, subsequent encounter  Hyperlipidemia, acquired -     Lipid panel  Other orders -     EKG     EKG was not completed here but it would not let me discontinue/cancel the order- it was done in ER already and did not need repeated, no chest discomfort today.   Today's key discussion points and After Visit Summary (AVS) reminders.  He was encouraged to contact our office by phone or message via MyChart if he has any questions or concerns regarding our treatment plan (see AVS). He was given an opportunity to ask questions/clarifications about any aspect of the diagnosis and treatment plan at today's visit. Common side effects, risks, benefits, and alternatives for medications and treatment plan prescribed today were discussed. He expressed understanding of the given instructions.  We discussed red flag symptoms and signs in detail and when to call the office or go to ER if his condition worsens (see AVS). He expressed understanding and AVS is used to reinforce. Medication list was reconciled and patient instructions and summary information was documented and made  available for him to review in the AVS (see AVS).  This entire medical encounter document is also available on MyChart for him to review for accuracy and understanding.  Review of this document is encouraged in the AVS. No barriers to understanding were identified      Subjective:  Patient presents today to establish care.  Chief Complaint  Patient presents with   Transfer of care   Annual Exam   Hypertension   Check Kidneys and heart    To make sure they are okay due to high BP (was told they could be affected by BP at the ER)   Red spots on skin    Small, on right arm and leg.    Problem-oriented charting was used to update the medical history: Problem  Snoring    STOP-Bang Score Do you snore loudly?: Yes Do you often feel tired, fatigued, or sleepy during the daytime?: Yes Has  anyone observed you stop breathing during sleep?: No Do you have (or are you being treated for) high blood pressure?: Yes Recent BMI (Calculated): 26.37 Is BMI greater than 35 kg/m2?: 0=No Age older than 51 years old?: 0=No Has large neck size > 40 cm (15.7 in, large male shirt size, large male collar size > 16): No Gender - Male: 1=Yes STOP-Bang Total Score: 4    Chest Discomfort   History of intermittent mild chest discomfort and poor cholesterol and uncontrolled hypertension family history of heart attack in grandfather in 67s   Strain of Calf Muscle  Hypertensive Nephropathy  Stage 3 Chronic Kidney Disease (Hcc)  Hyperlipidemia, Acquired  Hypertension   Was in 180's and emergency room started him on Current hypertension medications:       Sig   amLODipine (NORVASC) 5 MG tablet (Taking) Take 1 tablet (5 mg total) by mouth daily.   benazepril (LOTENSIN) 10 MG tablet (Taking) Take 4 tablets (40 mg total) by mouth daily.      Home readings were well controlled at 04/13/22 Risk factors family history diabetes brother, heart attack in 2s in grandfather, snoring, bad cholesterol   Multiple  Lung Nodules   PET SCAN IMPRESSION (05/11/16): There proximally 8 small peribronchovascular nodules in the lungs, at or below 5 mm in diameter. Consider follow up CT imaging over the next 6-12 months in order to reassess.      Depression Screen    04/05/2022    9:45 AM 03/16/2016    4:17 PM  PHQ 2/9 Scores  PHQ - 2 Score 0 1   No results found for any visits on 04/13/22.   The following were reviewed and entered/updated in epic: Past Medical History:  Diagnosis Date   Chest discomfort 04/13/2022   History of intermittent mild chest discomfort and poor cholesterol and uncontrolled hypertension   Hyperbilirubinemia    Multiple lung nodules 08/11/2016   Nephrolithiasis 08/11/2016   Sepsis ([place])    Splenic lesion 05/03/2016   Splenomegaly 05/03/2016   Thrombocytopenia, idiopathic (HCC)    Past Surgical History:  Procedure Laterality Date   TEE WITHOUT CARDIOVERSION N/A 03/08/2016   Procedure: TRANSESOPHAGEAL ECHOCARDIOGRAM (TEE);  Surgeon: Sueanne Margarita, MD;  Location: [facility] ENDOSCOPY;  Service: Cardiovascular;  Laterality: N/A;   Family History  Problem Relation Age of Onset   Diabetes Mother    Diabetes Father    Diabetes Maternal Grandfather    Heart disease Maternal Grandfather    Diabetes Brother    Outpatient Medications Prior to Visit  Medication Sig Dispense Refill   Multiple Vitamin (MULTIVITAMIN) tablet Take 1 tablet by mouth daily.     amLODipine (NORVASC) 5 MG tablet Take 1 tablet (5 mg total) by mouth daily. 30 tablet 0   benazepril (LOTENSIN) 10 MG tablet Take 4 tablets (40 mg total) by mouth daily. 120 tablet 0   No facility-administered medications prior to visit.    Allergies  Allergen Reactions   Bactrim [Sulfamethoxazole-Trimethoprim] Other (See Comments)    Jaundice & elevated bilirubin - see admission November 2017 for details   Advil [Ibuprofen]     Due to ITP.   Penicillins    Sulfa Antibiotics    Latex Rash   Social History   Tobacco Use    Smoking status: Never   Smokeless tobacco: Never  Vaping Use   Vaping Use: Never used  Substance Use Topics   Alcohol use: No   Drug use: No    Immunization History  Administered Date(s) Administered   Influenza Inj Mdck Quad Pf 02/01/2020   Influenza,inj,Quad PF,6+ Mos 03/08/2016, 01/22/2017, 01/05/2019   Influenza,inj,Quad PF,6-35 Mos 01/22/2017, 01/05/2019   Influenza-Unspecified 01/28/2017, 03/13/2021   Meningococcal Conjugate 01/22/2017   Meningococcal Mcv4o 12/26/2015, 08/24/2016   Pneumococcal Conjugate-13 08/24/2016    Objective:  BP 111/75 (BP Location: Left Arm, Patient Position: Sitting)   Pulse 74   Temp 98 F (36.7 C) (Temporal)   Resp 12   Ht _0  (1.803 m)   Wt 189 lb (85.7 kg)   SpO2 99%   BMI 26.36 kg/m  Body mass index is 26.36 kg/m.   Physical Exam  Vital signs reviewed.  Nursing notes reviewed. General Appearance/Constitutional:   Overweight male in no acute distress Musculoskeletal: All extremities are intact.  Neurological:  Awake, alert,  No obvious focal neurological deficits or cognitive impairments Psychiatric:  Appropriate mood, pleasant demeanor Problem-specific findings:  neck circumference is about 42 cm approximately    Results Reviewed: Results for orders placed or performed in visit on 04/05/22  CBC and differential  Result Value Ref Range   Hemoglobin 15.0 13.5 - 17.5   WBC 7.0   CBC  Result Value Ref Range   RBC 5.44 (A) 3.87 - 0.71  Basic metabolic panel  Result Value Ref Range   Glucose 99   Basic metabolic panel  Result Value Ref Range   Creatinine 1.9 (A) 0.6 - 1.3   Potassium 4.5 3.5 - 5.1 mEq/L   Sodium 143 137 - 147

## 2022-04-14 ENCOUNTER — Emergency Department (HOSPITAL_COMMUNITY): Payer: BC Managed Care – PPO

## 2022-04-14 ENCOUNTER — Observation Stay (HOSPITAL_COMMUNITY): Payer: BC Managed Care – PPO

## 2022-04-14 ENCOUNTER — Encounter: Payer: Self-pay | Admitting: Internal Medicine

## 2022-04-14 ENCOUNTER — Observation Stay (HOSPITAL_COMMUNITY)
Admission: EM | Admit: 2022-04-14 | Discharge: 2022-04-15 | Disposition: A | Discharge status: Home / Self Care | Payer: BC Managed Care – PPO | Attending: Emergency Medicine | Admitting: Emergency Medicine

## 2022-04-14 ENCOUNTER — Other Ambulatory Visit: Payer: Self-pay

## 2022-04-14 ENCOUNTER — Encounter: Payer: Self-pay | Admitting: *Deleted

## 2022-04-14 DIAGNOSIS — E875 Hyperkalemia: Principal | ICD-10-CM

## 2022-04-14 DIAGNOSIS — I10 Essential (primary) hypertension: Secondary | ICD-10-CM | POA: Diagnosis not present

## 2022-04-14 DIAGNOSIS — R079 Chest pain, unspecified: Secondary | ICD-10-CM | POA: Diagnosis not present

## 2022-04-14 DIAGNOSIS — Z79899 Other long term (current) drug therapy: Secondary | ICD-10-CM | POA: Diagnosis not present

## 2022-04-14 DIAGNOSIS — I82811 Embolism and thrombosis of superficial veins of right lower extremities: Secondary | ICD-10-CM | POA: Diagnosis not present

## 2022-04-14 DIAGNOSIS — J9811 Atelectasis: Secondary | ICD-10-CM | POA: Diagnosis not present

## 2022-04-14 DIAGNOSIS — I129 Hypertensive chronic kidney disease with stage 1 through stage 4 chronic kidney disease, or unspecified chronic kidney disease: Secondary | ICD-10-CM | POA: Diagnosis not present

## 2022-04-14 DIAGNOSIS — X58XXXA Exposure to other specified factors, initial encounter: Secondary | ICD-10-CM | POA: Insufficient documentation

## 2022-04-14 DIAGNOSIS — Z91040 Latex allergy status: Secondary | ICD-10-CM | POA: Diagnosis not present

## 2022-04-14 DIAGNOSIS — S86819A Strain of other muscle(s) and tendon(s) at lower leg level, unspecified leg, initial encounter: Secondary | ICD-10-CM | POA: Diagnosis present

## 2022-04-14 DIAGNOSIS — E871 Hypo-osmolality and hyponatremia: Secondary | ICD-10-CM | POA: Diagnosis not present

## 2022-04-14 DIAGNOSIS — S86811A Strain of other muscle(s) and tendon(s) at lower leg level, right leg, initial encounter: Secondary | ICD-10-CM | POA: Insufficient documentation

## 2022-04-14 DIAGNOSIS — N189 Chronic kidney disease, unspecified: Secondary | ICD-10-CM | POA: Diagnosis present

## 2022-04-14 DIAGNOSIS — N179 Acute kidney failure, unspecified: Secondary | ICD-10-CM | POA: Diagnosis not present

## 2022-04-14 DIAGNOSIS — D693 Immune thrombocytopenic purpura: Secondary | ICD-10-CM | POA: Insufficient documentation

## 2022-04-14 LAB — CREATININE, URINE, RANDOM: Creatinine, Urine: 45 mg/dL

## 2022-04-14 LAB — URINALYSIS, COMPLETE (UACMP) WITH MICROSCOPIC
Bacteria, UA: NONE SEEN
Bilirubin Urine: NEGATIVE
Glucose, UA: NEGATIVE mg/dL
Hgb urine dipstick: NEGATIVE
Ketones, ur: NEGATIVE mg/dL
Leukocytes,Ua: NEGATIVE
Nitrite: NEGATIVE
Protein, ur: NEGATIVE mg/dL
Specific Gravity, Urine: 1.004 — ABNORMAL LOW (ref 1.005–1.030)
pH: 5 (ref 5.0–8.0)

## 2022-04-14 LAB — COMPREHENSIVE METABOLIC PANEL
AG Ratio: 1.5 (calc) (ref 1.0–2.5)
ALT: 39 U/L (ref 9–46)
AST: 25 U/L (ref 10–35)
Albumin: 4.6 g/dL (ref 3.6–5.1)
Alkaline phosphatase (APISO): 69 U/L (ref 35–144)
BUN/Creatinine Ratio: 26 (calc) — ABNORMAL HIGH (ref 6–22)
BUN: 80 mg/dL — ABNORMAL HIGH (ref 7–25)
CO2: 21 mmol/L (ref 20–32)
Calcium: 9.3 mg/dL (ref 8.6–10.3)
Chloride: 102 mmol/L (ref 98–110)
Creat: 3.13 mg/dL — ABNORMAL HIGH (ref 0.70–1.30)
Globulin: 3.1 g/dL (calc) (ref 1.9–3.7)
Glucose, Bld: 98 mg/dL (ref 65–99)
Potassium: 7.4 mmol/L (ref 3.5–5.3)
Sodium: 131 mmol/L — ABNORMAL LOW (ref 135–146)
Total Bilirubin: 0.5 mg/dL (ref 0.2–1.2)
Total Protein: 7.7 g/dL (ref 6.1–8.1)

## 2022-04-14 LAB — TROPONIN I (HIGH SENSITIVITY)
Troponin I (High Sensitivity): 6 ng/L (ref <18)
Troponin I (High Sensitivity): 5 ng/L (ref <18)

## 2022-04-14 LAB — BASIC METABOLIC PANEL
Anion gap: 8 (ref 5–15)
Anion gap: 14 (ref 5–15)
Anion gap: 9 (ref 5–15)
BUN: 77 mg/dL — ABNORMAL HIGH (ref 6–20)
BUN: 77 mg/dL — ABNORMAL HIGH (ref 6–20)
BUN: 70 mg/dL — ABNORMAL HIGH (ref 6–20)
CO2: 21 mmol/L — ABNORMAL LOW (ref 22–32)
CO2: 13 mmol/L — ABNORMAL LOW (ref 22–32)
CO2: 24 mmol/L (ref 22–32)
Calcium: 9.2 mg/dL (ref 8.9–10.3)
Calcium: 8.7 mg/dL — ABNORMAL LOW (ref 8.9–10.3)
Calcium: 8.7 mg/dL — ABNORMAL LOW (ref 8.9–10.3)
Chloride: 102 mmol/L (ref 98–111)
Chloride: 104 mmol/L (ref 98–111)
Chloride: 103 mmol/L (ref 98–111)
Creatinine, Ser: 3.07 mg/dL — ABNORMAL HIGH (ref 0.61–1.24)
Creatinine, Ser: 2.81 mg/dL — ABNORMAL HIGH (ref 0.61–1.24)
Creatinine, Ser: 2.7 mg/dL — ABNORMAL HIGH (ref 0.61–1.24)
GFR, Estimated: 24 mL/min — ABNORMAL LOW (ref >60)
GFR, Estimated: 27 mL/min — ABNORMAL LOW (ref >60)
GFR, Estimated: 28 mL/min — ABNORMAL LOW (ref >60)
Glucose, Bld: 106 mg/dL — ABNORMAL HIGH (ref 70–99)
Glucose, Bld: 102 mg/dL — ABNORMAL HIGH (ref 70–99)
Glucose, Bld: 68 mg/dL — ABNORMAL LOW (ref 70–99)
Potassium: 7 mmol/L (ref 3.5–5.1)
Potassium: 6.1 mmol/L — ABNORMAL HIGH (ref 3.5–5.1)
Potassium: 4.6 mmol/L (ref 3.5–5.1)
Sodium: 131 mmol/L — ABNORMAL LOW (ref 135–145)
Sodium: 131 mmol/L — ABNORMAL LOW (ref 135–145)
Sodium: 136 mmol/L (ref 135–145)

## 2022-04-14 LAB — CBC
HCT: 42.4 % (ref 39.0–52.0)
Hemoglobin: 14.7 g/dL (ref 13.0–17.0)
MCH: 27.1 pg (ref 26.0–34.0)
MCHC: 34.7 g/dL (ref 30.0–36.0)
MCV: 78.1 fL — ABNORMAL LOW (ref 80.0–100.0)
Platelets: 113 10*3/uL — ABNORMAL LOW (ref 150–400)
RBC: 5.43 MIL/uL (ref 4.22–5.81)
RDW: 13.1 % (ref 11.5–15.5)
WBC: 5.5 10*3/uL (ref 4.0–10.5)
nRBC: 0 % (ref 0.0–0.2)

## 2022-04-14 LAB — PROTEIN / CREATININE RATIO, URINE
Creatinine, Urine: 14 mg/dL
Total Protein, Urine: <6 mg/dL

## 2022-04-14 LAB — LIPID PANEL
Cholesterol: 182 mg/dL (ref <200)
HDL: 45 mg/dL (ref >=40)
LDL Cholesterol (Calc): 113 mg/dL (calc) — ABNORMAL HIGH
Non-HDL Cholesterol (Calc): 137 mg/dL (calc) — ABNORMAL HIGH (ref <130)
Total CHOL/HDL Ratio: 4 (calc) (ref <5.0)
Triglycerides: 125 mg/dL (ref <150)

## 2022-04-14 LAB — URINALYSIS, ROUTINE W REFLEX MICROSCOPIC
Bilirubin Urine: NEGATIVE
Glucose, UA: NEGATIVE mg/dL
Hgb urine dipstick: NEGATIVE
Ketones, ur: NEGATIVE mg/dL
Leukocytes,Ua: NEGATIVE
Nitrite: NEGATIVE
Protein, ur: NEGATIVE mg/dL
Specific Gravity, Urine: 1.006 (ref 1.005–1.030)
pH: 5 (ref 5.0–8.0)

## 2022-04-14 LAB — SODIUM, URINE, RANDOM: Sodium, Ur: 29 mmol/L

## 2022-04-14 LAB — URIC ACID: Uric Acid, Serum: 12.6 mg/dL — ABNORMAL HIGH (ref 4.0–8.0)

## 2022-04-14 LAB — MAGNESIUM
Magnesium: 3.4 mg/dL — ABNORMAL HIGH (ref 1.5–2.5)
Magnesium: 3.1 mg/dL — ABNORMAL HIGH (ref 1.7–2.4)

## 2022-04-14 LAB — CBG MONITORING, ED
Glucose-Capillary: 110 mg/dL — ABNORMAL HIGH (ref 70–99)
Glucose-Capillary: 67 mg/dL — ABNORMAL LOW (ref 70–99)
Glucose-Capillary: 130 mg/dL — ABNORMAL HIGH (ref 70–99)

## 2022-04-14 LAB — D-DIMER, QUANTITATIVE: D-Dimer, Quant: 1.58 ug/mL-FEU — ABNORMAL HIGH (ref 0.00–0.50)

## 2022-04-14 LAB — CK: Total CK: 123 U/L (ref 49–397)

## 2022-04-14 LAB — PHOSPHORUS: Phosphorus: 5.6 mg/dL — ABNORMAL HIGH (ref 2.5–4.5)

## 2022-04-14 LAB — POTASSIUM: Potassium: 4.7 mmol/L (ref 3.5–5.1)

## 2022-04-14 MED ORDER — SODIUM CHLORIDE 0.9 % IV SOLN: INTRAVENOUS | Status: DC

## 2022-04-14 MED ORDER — HYDRALAZINE HCL 25 MG PO TABS: 25.0000 mg | ORAL_TABLET | Freq: Four times a day (QID) | ORAL | Status: DC | PRN

## 2022-04-14 MED ORDER — CALCIUM GLUCONATE-NACL 1-0.675 GM/50ML-% IV SOLN
1.0000 g | Freq: Once | INTRAVENOUS | Status: AC
  Administered 2022-04-14: 1000 mg via INTRAVENOUS
  Filled 2022-04-14: qty 50

## 2022-04-14 MED ORDER — DEXTROSE 50 % IV SOLN
1.0000 | Freq: Once | INTRAVENOUS | Status: AC
  Administered 2022-04-14: 50 mL via INTRAVENOUS
  Filled 2022-04-14: qty 50

## 2022-04-14 MED ORDER — SODIUM ZIRCONIUM CYCLOSILICATE 10 G PO PACK
10.0000 g | PACK | Freq: Once | ORAL | Status: AC
  Administered 2022-04-14: 10 g via ORAL
  Filled 2022-04-14: qty 1

## 2022-04-14 MED ORDER — SODIUM CHLORIDE 0.9 % IV SOLN: Freq: Once | INTRAVENOUS | Status: DC

## 2022-04-14 MED ORDER — SODIUM CHLORIDE 0.9 % IV BOLUS
1000.0000 mL | Freq: Once | INTRAVENOUS | Status: AC
  Administered 2022-04-14: 1000 mL via INTRAVENOUS

## 2022-04-14 MED ORDER — FUROSEMIDE 10 MG/ML IJ SOLN
40.0000 mg | Freq: Once | INTRAMUSCULAR | Status: AC
  Administered 2022-04-14: 40 mg via INTRAVENOUS
  Filled 2022-04-14: qty 4

## 2022-04-14 MED ORDER — INSULIN ASPART 100 UNIT/ML IV SOLN
5.0000 [IU] | Freq: Once | INTRAVENOUS | Status: AC
  Administered 2022-04-14: 5 [IU] via INTRAVENOUS

## 2022-04-14 MED ORDER — SODIUM BICARBONATE 8.4 % IV SOLN
50.0000 meq | Freq: Once | INTRAVENOUS | Status: AC
  Administered 2022-04-14: 50 meq via INTRAVENOUS
  Filled 2022-04-14: qty 50

## 2022-04-14 MED ORDER — HEPARIN SODIUM (PORCINE) 5000 UNIT/ML IJ SOLN
5000.0000 [IU] | Freq: Two times a day (BID) | INTRAMUSCULAR | Status: DC
  Filled 2022-04-14 (×3): qty 1

## 2022-04-14 NOTE — ED Triage Notes (Signed)
Pt. Stated. I hada physical yesterday and my Dr. Jeanene Erb and said my K was elevated to come here.

## 2022-04-14 NOTE — Progress Notes (Signed)
Repeat K=6.1 and Cre=2.8, one additional dose of Lokelma ordered.

## 2022-04-14 NOTE — Consult Note (Signed)
[name] Renal Consultation Note  Requesting MD: Pricilla Loveless, MD Indication for Consultation:  AKI and hyperkalemia  Chief complaint: sent here for abnormal labs  HPI:  Joshua Wagner is a 51 y.o. male with a history of ITP who presented to the hospital with abnormal labs.  Note that he had previously presented to the ER on 12/3 with right calf muscle pain.  He had a creatinine of 1.9 at that time.  He was given amlodipine 5 mg daily and benazepril 10 mg daily to start for hypertension noted during the visit.  He followed up with his regular provider who repeated blood work which demonstrated hyperkalemia and acute kidney injury.  He was directed to the ER.  Here in the ER he has had lokelma, normal saline, lasix, 1 amp bicarb, insulin/d50 and calcium gluconate.  Nephrology is consulted for assistance with management of hyperkalemia.  His potassium improved from 7.0 to 6.1 with note that the 6.1 value could indicate hemolysis.  Cr improved slightly from 3.13 at peak to 2.81 on repeat.  Note 12/5 HbA1c was normal at 5.1. UA on 12/5 noted protein but not otherwise characterized.  His wife is co-historian.  They are not aware of a history of nephrolithiasis.  No n/v/d.  He has been trying to follow a low salt diet since diagnosis of HTN.  He had come to the ER another time and noted BP of at or near 200 systolic and they state this persisted at home, prompting the medication initiation at a later ER visit.  He has been eating a banana daily, kiwis.  He is a Education officer, community.  For his calf injury he denies any NSAID use.  He worked last week with a Statistician.  No meds prior to the anti-hypertensives.    Creatinine  Date/Time Value Ref Range Status  04/03/2022 12:00 AM 1.9 (A) 0.6 - 1.3 Final   Creat  Date/Time Value Ref Range Status  04/13/2022 04:21 PM 3.13 (H) 0.70 - 1.30 mg/dL Final  68/03/5725 20:35 PM 0.99 0.60 - 1.35 mg/dL Final   Creatinine, Ser  Date/Time Value Ref Range  Status  04/14/2022 12:28 PM 2.81 (H) 0.61 - 1.24 mg/dL Final  59/74/1638 45:36 AM 3.07 (H) 0.61 - 1.24 mg/dL Final  46/80/3212 24:82 AM 1.12 0.40 - 1.50 mg/dL Final  50/07/7046 88:91 AM 1.02 0.61 - 1.24 mg/dL Final  69/45/0388 82:80 AM 1.16 0.40 - 1.50 mg/dL Final  03/49/1791 50:56 AM 1.02 0.61 - 1.24 mg/dL Final  97/94/8016 55:37 AM 0.80 0.61 - 1.24 mg/dL Final  48/27/0786 75:44 AM 0.95 0.61 - 1.24 mg/dL Final  92/05/69 21:97 AM 1.07 0.61 - 1.24 mg/dL Final  58/83/2549 82:64 PM 1.11 0.61 - 1.24 mg/dL Final     PMHx:   Past Medical History:  Diagnosis Date   Chest discomfort 04/13/2022   History of intermittent mild chest discomfort and poor cholesterol and uncontrolled hypertension   Hyperbilirubinemia    Multiple lung nodules 08/11/2016   Nephrolithiasis 08/11/2016   Sepsis (HCC)    Splenic lesion 05/03/2016   Splenomegaly 05/03/2016   Thrombocytopenia, idiopathic (HCC)     Past Surgical History:  Procedure Laterality Date   TEE WITHOUT CARDIOVERSION N/A 03/08/2016   Procedure: TRANSESOPHAGEAL ECHOCARDIOGRAM (TEE);  Surgeon: Quintella Reichert, MD;  Location: [facility] ENDOSCOPY;  Service: Cardiovascular;  Laterality: N/A;    Family Hx:  Family History  Problem Relation Age of Onset   Diabetes Mother    Diabetes Father  Diabetes Maternal Grandfather    Heart disease Maternal Grandfather    Diabetes Brother   No family history of CKD  Social History:  reports that he has never smoked. He has never used smokeless tobacco. 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   Penicillins    Sulfa Antibiotics    Latex Rash    Medications: Prior to Admission medications   Medication Sig Start Date End Date Taking? Authorizing  Provider  amLODipine (NORVASC) 5 MG tablet Take 1 tablet (5 mg total) by mouth daily. 04/13/22  Yes Lula Olszewski, MD  benazepril (LOTENSIN) 10 MG tablet Take 4 tablets (40 mg total) by mouth daily. 04/13/22  Yes Lula Olszewski, MD  Multiple Vitamin (MULTIVITAMIN) tablet Take 1 tablet by mouth daily.   Yes [provider]    I have reviewed the patient's current and reported prior to admission medications.  Labs:     Latest Ref Rng & Units 04/14/2022   12:28 PM 04/14/2022    8:17 AM 04/13/2022    4:21 PM  BMP  Glucose 70 - 99 mg/dL 564  332  98   BUN 6 - 20 mg/dL 77  77  80   Creatinine 0.61 - 1.24 mg/dL 9.51  8.84  1.66   BUN/Creat Ratio 6 - 22 (calc)   26   Sodium 135 - 145 mmol/L 131  131  131   Potassium 3.5 - 5.1 mmol/L 6.1  7.0  7.4   Chloride 98 - 111 mmol/L 104  102  102   CO2 22 - 32 mmol/L 13  21  21    Calcium 8.9 - 10.3 mg/dL 8.7  9.2  9.3     Urinalysis    Component Value Date/Time   COLORURINE STRAW (A) 04/14/2022 1320   APPEARANCEUR CLEAR 04/14/2022 1320   LABSPEC 1.006 04/14/2022 1320   PHURINE 5.0 04/14/2022 1320   GLUCOSEU NEGATIVE 04/14/2022 1320   GLUCOSEU NEGATIVE 04/13/2022 0937   HGBUR NEGATIVE 04/14/2022 1320   BILIRUBINUR NEGATIVE 04/14/2022 1320   BILIRUBINUR neg 04/05/2022 0953   KETONESUR NEGATIVE 04/14/2022 1320   PROTEINUR NEGATIVE 04/14/2022 1320   UROBILINOGEN 0.2 04/13/2022 0937   NITRITE NEGATIVE 04/14/2022 1320   LEUKOCYTESUR NEGATIVE 04/14/2022 1320     ROS:  Pertinent items noted in HPI and remainder of comprehensive ROS otherwise negative.  Physical Exam: Vitals:   04/14/22 1245 04/14/22 1325  BP: (!) 115/95 (!) 134/93  Pulse: 61 62  Resp: 14 17  Temp:    SpO2: 90% 100%     General:  adult male in stretcher in NAD  HEENT: NCAT Eyes: EOMI sclera anicteric Neck: supple trachea midline  Heart: S1S2 no rub Lungs: clear and unlabored; on room air  Abdomen: soft/NT/ND  Extremities: no edema; no cyanosis or  clubbing; right foot and lower leg in boot Skin: no rash on extremities exposed Neuro: alert and oriented x 3 provides hx and follows commands  Psych normal mood and affect  Assessment/Plan:  # Hyperkalemia  - Setting of AKI and benazepril use  - Improving on repeat - Update BMP as note of hemolysis on last labs - Will need a low potassium diet here and on discharge for now   #  AKI - ischemic and pre-renal insults - setting of benazepril initiation and perhaps has component of normotensive ATN in the setting of initiation of two anti-hypertensives for new diagnosis of HTN.  UA negative protein and no microscopic - Continue hydration as tolerated  - he has gotten a renal US - read not yet available  - Please hold benazepril  - Obtain UA and up/cr ratio  # HTN  - As above, please hold benazepril  - If agent needed would choose amlodipine for now   # Thrombocytopenia - Mild currently with platelets 120 on presentation   Disposition - acceptable for discharge home with nephrology follow-up once potassium normalizes.  I will set up nephrology follow-up with me at [facility] in 1-2 weeks and I will set up a repeat BMP for early next week at [facility] with results to be routed to me   Claudia Desanctis 04/14/2022, 3:00 PM

## 2022-04-14 NOTE — ED Provider Notes (Signed)
[facility] EMERGENCY DEPARTMENT Provider Note   CSN: [id number] Arrival date & time: 04/14/22  0703     History  Chief Complaint  Patient presents with   abnormal  labs   Chest Pain    Joshua Wagner is a 51 y.o. male.  HPI 51 year old male presents with hyperkalemia.  He was sent in by his doctor.  A couple weeks ago he injured his right calf and had to come to the ER and had an MRI that showed a myofascial injury.  He also had significant hypertension at that time.  It does not appear that any meds were started.  He went back to the ER on 12/3 for uncontrolled hypertension and was prescribed amlodipine and benazepril.  Since then he followed up at his PCPs office yesterday and he had labs drawn.  Was called at 6 AM with a potassium of 7 and told to come to the ER.  He states he been having some on and off chest pain that feels like a burning and pressure over the last few days.  Feels like reflux.  No shortness of breath, cough, fever, diarrhea, vomiting.  He has not been using any NSAIDs. No current chest pain. His right calf/leg is improving. No numbness.  He has been urinating normally.  Home Medications Prior to Admission medications   Medication Sig Start Date End Date Taking? Authorizing Provider  amLODipine (NORVASC) 5 MG tablet Take 1 tablet (5 mg total) by mouth daily. 04/13/22   Lula Olszewski, MD  benazepril (LOTENSIN) 10 MG tablet Take 4 tablets (40 mg total) by mouth daily. 04/13/22   Lula Olszewski, MD  Multiple Vitamin (MULTIVITAMIN) tablet Take 1 tablet by mouth daily.    [provider]      Allergies    Bactrim [sulfamethoxazole-trimethoprim], Advil [ibuprofen], Penicillins, Sulfa antibiotics, and Latex    Review of Systems   Review of Systems  Constitutional:  Negative for fever.  Respiratory:  Negative for cough and shortness of breath.   Cardiovascular:  Positive for chest pain (no current chest pain).  Gastrointestinal:   Negative for diarrhea and vomiting.  Musculoskeletal:  Positive for myalgias.    Physical Exam Updated Vital Signs BP (!) 115/95   Pulse 61   Temp 97.9 F (36.6 C)   Resp 14   Ht 5\' 11"  (1.803 m)   Wt 86.2 kg   SpO2 90%   BMI 26.50 kg/m  Physical Exam Vitals and nursing note reviewed.  Constitutional:      Appearance: He is well-developed.  HENT:     Head: Normocephalic and atraumatic.  Cardiovascular:     Rate and Rhythm: Normal rate and regular rhythm.     Pulses:          Posterior tibial pulses are 2+ on the right side.     Heart sounds: Normal heart sounds.  Pulmonary:     Effort: Pulmonary effort is normal.     Breath sounds: Normal breath sounds.  Abdominal:     Palpations: Abdomen is soft.     Tenderness: There is no abdominal tenderness.  Musculoskeletal:     Comments: Right calf is in a boot and with an acewrap. When boot was removed, there is no apparent swelling to the calf. No real tenderness but it hurts to extend his knee.   Skin:    General: Skin is warm and dry.  Neurological:     Mental Status: He is alert.  ED Results / Procedures / Treatments   Labs (all labs ordered are listed, but only abnormal results are displayed) Labs Reviewed  BASIC METABOLIC PANEL - Abnormal; Notable for the following components:      Result Value   Sodium 131 (*)    Potassium 7.0 (*)    CO2 21 (*)    Glucose, Bld 106 (*)    BUN 77 (*)    Creatinine, Ser 3.07 (*)    GFR, Estimated 24 (*)    All other components within normal limits  CBC - Abnormal; Notable for the following components:   MCV 78.1 (*)    Platelets 113 (*)    All other components within normal limits  CBG MONITORING, ED - Abnormal; Notable for the following components:   Glucose-Capillary 110 (*)    All other components within normal limits  MAGNESIUM  URINALYSIS, ROUTINE W REFLEX MICROSCOPIC  CREATININE, URINE, RANDOM  SODIUM, URINE, RANDOM  CK  BASIC METABOLIC PANEL  TROPONIN I (HIGH  SENSITIVITY)  TROPONIN I (HIGH SENSITIVITY)    EKG EKG Interpretation  Date/Time:  Thursday April 14 2022 07:53:52 EST Ventricular Rate:  79 PR Interval:  160 QRS Duration: 102 QT Interval:  368 QTC Calculation: 421 R Axis:   76 Text Interpretation: Normal sinus rhythm Normal ECG No previous ECGs available Confirmed by Vivi Barrack [id number]) on 04/14/2022 11:55:18 AM  Radiology DG Chest 2 View  Result Date: 04/14/2022 CLINICAL DATA:  Provided history: Chest pain. EXAM: CHEST - 2 VIEW COMPARISON:  Prior chest radiographs 03/04/2016 and earlier. FINDINGS: Heart size within normal limits. Small bandlike opacity within the left lung base, with an appearance favoring atelectasis. No appreciable airspace consolidation on the right. No evidence of pleural effusion or pneumothorax. Degenerative changes of the spine. IMPRESSION: Small bandlike opacity within the left lung base. The appearance favors atelectasis. However, pneumonia is difficult to exclude. Correlate clinically and consider short-interval radiographic follow-up. Electronically Signed   By: Jackey Loge D.O.   On: 04/14/2022 08:39    Procedures .Critical Care  Performed by: Pricilla Loveless, MD Authorized by: Pricilla Loveless, MD   Critical care provider statement:    Critical care time (minutes):  40   Critical care time was exclusive of:  Separately billable procedures and treating other patients   Critical care was necessary to treat or prevent imminent or life-threatening deterioration of the following conditions:  Metabolic crisis and renal failure   Critical care was time spent personally by me on the following activities:  Development of treatment plan with patient or surrogate, discussions with consultants, evaluation of patient's response to treatment, examination of patient, ordering and review of laboratory studies, ordering and review of radiographic studies, ordering and performing treatments and interventions, pulse  oximetry, re-evaluation of patient's condition and review of old charts     Medications Ordered in ED Medications  insulin aspart (novoLOG) injection 5 Units (has no administration in time range)    And  dextrose 50 % solution 50 mL (has no administration in time range)  sodium bicarbonate injection 50 mEq (has no administration in time range)  0.9 %  sodium chloride infusion (has no administration in time range)  sodium chloride 0.9 % bolus 1,000 mL (1,000 mLs Intravenous New Bag/Given 04/14/22 1224)  calcium gluconate 1 g/ 50 mL sodium chloride IVPB (0 mg Intravenous Stopped 04/14/22 1253)  furosemide (LASIX) injection 40 mg (40 mg Intravenous Given 04/14/22 1222)  sodium zirconium cyclosilicate (LOKELMA) packet 10 g (10 g  Oral Given 04/14/22 1225)    ED Course/ Medical Decision Making/ A&P                           Medical Decision Making Amount and/or Complexity of Data Reviewed External Data Reviewed: notes. Labs: ordered.    Details: Hyperkalemia at 7.0 and an AKI at 3.0. Radiology: ordered and independent interpretation performed.    Details: No CHF on chest x-ray ECG/medicine tests: independent interpretation performed.    Details: No signs of hyperkalemia or acute ischemia  Risk OTC drugs. Prescription drug management. Decision regarding hospitalization.   Patient is well-appearing.  I discussed case with Dr. Malen Gauze of nephrology.  Patient was given calcium, Lasix, fluids, Lokelma prior to me seeing him.  After discussion with Dr. Malen Gauze, she agrees with insulin and glucose and also asked for an amp of bicarb.  Will recheck a BMP.  Will hydrate with normal saline per her recommendations.  Could be related to medications but could also be related to normalization of BP when he was so high earlier.  Either way we will need admission and monitoring.  Discussed with hospitalist, Dr. Chipper Herb.  From a chest pain perspective, he is chest pain-free and is for troponin is negative.   Doubt ACS.        Final Clinical Impression(s) / ED Diagnoses Final diagnoses:  AKI (acute kidney injury) (HCC)  Hyperkalemia  Hyponatremia    Rx / DC Orders ED Discharge Orders     None         Pricilla Loveless, MD 04/14/22 1314

## 2022-04-14 NOTE — ED Notes (Signed)
Echo at bedside

## 2022-04-14 NOTE — ED Notes (Signed)
Pt would like to speak to MD about heparin sub-Q before administration due to pt already having low platelet count.  Currently refusing 5,000 units of sub-Q heparin ordered.

## 2022-04-14 NOTE — ED Notes (Signed)
Potassium 7.0

## 2022-04-14 NOTE — ED Notes (Signed)
Dr. Chipper Herb notified of cbg of 67; pt a and o x 4; pt given juice, peanut butter, and crackers; will continue to monitor as able

## 2022-04-14 NOTE — ED Notes (Signed)
ED TO INPATIENT HANDOFF REPORT  ED Nurse Name and Phone #: Parke Simmersvan kretschmar  S Name/Age/Gender Rich NumberKamran Spreen 51 y.o. male Room/Bed: 045C/045C  Code Status   Code Status: Full Code  Home/SNF/Other Home Patient oriented to: self, place, time, and situation Is this baseline? Yes   Triage Complete: Triage complete  Chief Complaint Hyperkalemia [E87.5]  Triage Note Pt. Stated. I hada physical yesterday and my Dr. Jeanene Erballed and said my K was elevated to come here.   Allergies Allergies  Allergen Reactions   Bactrim [Sulfamethoxazole-Trimethoprim] Other (See Comments)    Jaundice & elevated bilirubin - see admission November 2017 for details   Advil [Ibuprofen]     Due to ITP.   Penicillins    Sulfa Antibiotics    Latex Rash    Level of Care/Admitting Diagnosis ED Disposition     ED Disposition  Admit   Condition  --   Comment  [facility] [[id number]]  Level of Care: Telemetry Medical [104]  [facility] patient in [facility] at [facility] or [facility] if equivalent level of care is available:: No  Covid Evaluation: Asymptomatic - no recent exposure (last 10 days) testing not required  Diagnosis: Hyperkalemia [161096][[id number]]  Admitting Physician: Emeline GeneralZHANG, PING T [[id number]]  Attending Physician: Emeline GeneralZHANG, PING T [[id number]]          B Medical/Surgery History Past Medical History:  Diagnosis Date   Chest discomfort 04/13/2022   History of intermittent mild chest discomfort and poor cholesterol and uncontrolled hypertension   Hyperbilirubinemia    Multiple lung nodules 08/11/2016   Nephrolithiasis 08/11/2016   Sepsis (HCC)    Splenic lesion 05/03/2016   Splenomegaly 05/03/2016   Thrombocytopenia, idiopathic (HCC)    Past Surgical History:  Procedure Laterality Date   TEE WITHOUT CARDIOVERSION N/A 03/08/2016   Procedure: TRANSESOPHAGEAL ECHOCARDIOGRAM (TEE);  Surgeon: Quintella Reichertraci R Turner, MD;  [facility];  Service: Cardiovascular;   Laterality: N/A;     A IV Location/Drains/Wounds Patient Lines/Drains/Airways Status     Active Line/Drains/Airways     Name Placement date Placement time Site Days   Peripheral IV 04/14/22 20 G Anterior;Distal;Left;Upper Arm 04/14/22  1220  Arm  less than 1   Wound / Incision (Open or Dehisced) 03/05/16 Incision - Open Buttocks Right draining abcess right intergluteal fold 03/05/16  0800  Buttocks  2231            Intake/Output Last 24 hours No intake or output data in the 24 hours ending 04/14/22 1546  Labs/Imaging Results for orders placed or performed during the hospital encounter of 04/14/22 (from the past 48 hour(s))  Basic metabolic panel     Status: Abnormal   Collection Time: 04/14/22  8:17 AM  Result Value Ref Range   Sodium 131 (L) 135 - 145 mmol/L   Potassium 7.0 (HH) 3.5 - 5.1 mmol/L    Comment: CRITICAL RESULT CALLED TO, READ BACK BY AND VERIFIED WITH Merryl HackerSCARLETTE COOKE RN @ [phone] 04/14/22 LEONARD,A   Chloride 102 98 - 111 mmol/L   CO2 21 (L) 22 - 32 mmol/L   Glucose, Bld 106 (H) 70 - 99 mg/dL    Comment: Glucose reference range applies only to samples taken after fasting for at least 8 hours.   BUN 77 (H) 6 - 20 mg/dL   Creatinine, Ser 2.133.07 (H) 0.61 - 1.24 mg/dL   Calcium 9.2 8.9 - 08.610.3 mg/dL   GFR, Estimated 24 (L) >60 mL/min    Comment: (NOTE)  Calculated using the CKD-EPI Creatinine Equation (2021)    Anion gap 8 5 - 15    Comment: Performed at [facility], [address], [place], [place] [address]  CBC     Status: Abnormal   Collection Time: 04/14/22  8:17 AM  Result Value Ref Range   WBC 5.5 4.0 - 10.5 K/uL   RBC 5.43 4.22 - 5.81 MIL/uL   Hemoglobin 14.7 13.0 - 17.0 g/dL   HCT 19.1 47.8 - 29.5 %   MCV 78.1 (L) 80.0 - 100.0 fL   MCH 27.1 26.0 - 34.0 pg   MCHC 34.7 30.0 - 36.0 g/dL   RDW 62.1 30.8 - 65.7 %   Platelets 113 (L) 150 - 400 K/uL   nRBC 0.0 0.0 - 0.2 %    Comment: Performed at [facility], [address], [place],  [place] [address]  Troponin I (High Sensitivity)     Status: None   Collection Time: 04/14/22  8:17 AM  Result Value Ref Range   Troponin I (High Sensitivity) 6 <18 ng/L    Comment: (NOTE) Elevated high sensitivity troponin I (hsTnI) values and significant  changes across serial measurements may suggest ACS but many other  chronic and acute conditions are known to elevate hsTnI results.  Refer to the "Links" section for chest pain algorithms and additional  guidance. Performed at [facility], [address] [place], [place] [address]   Troponin I (High Sensitivity)     Status: None   Collection Time: 04/14/22 12:28 PM  Result Value Ref Range   Troponin I (High Sensitivity) 5 <18 ng/L    Comment: (NOTE) Elevated high sensitivity troponin I (hsTnI) values and significant  changes across serial measurements may suggest ACS but many other  chronic and acute conditions are known to elevate hsTnI results.  Refer to the "Links" section for chest pain algorithms and additional  guidance. Performed at [facility], [address], [place], [place] [address]   Magnesium     Status: Abnormal   Collection Time: 04/14/22 12:28 PM  Result Value Ref Range   Magnesium 3.1 (H) 1.7 - 2.4 mg/dL    Comment: Performed at [facility], [address] [place], [place] [address]  CK     Status: None   Collection Time: 04/14/22 12:28 PM  Result Value Ref Range   Total CK 123 49 - 397 U/L    Comment: HEMOLYSIS AT THIS LEVEL MAY AFFECT RESULT Performed at [facility], [address], [place], [place] [address]   Basic metabolic panel     Status: Abnormal   Collection Time: 04/14/22 12:28 PM  Result Value Ref Range   Sodium 131 (L) 135 - 145 mmol/L   Potassium 6.1 (H) 3.5 - 5.1 mmol/L    Comment: HEMOLYSIS AT THIS LEVEL MAY AFFECT RESULT   Chloride 104 98 - 111 mmol/L   CO2 13 (L) 22 - 32 mmol/L   Glucose, Bld 102 (H) 70 - 99 mg/dL    Comment: Glucose reference range applies  only to samples taken after fasting for at least 8 hours.   BUN 77 (H) 6 - 20 mg/dL   Creatinine, Ser 6.44 (H) 0.61 - 1.24 mg/dL   Calcium 8.7 (L) 8.9 - 10.3 mg/dL   GFR, Estimated 27 (L) >60 mL/min    Comment: (NOTE) Calculated using the CKD-EPI Creatinine Equation (2021)    Anion gap 14 5 -  15    Comment: Performed at [facility], [address] [place], [place] [address]  CBG monitoring, ED     Status: Abnormal   Collection Time: 04/14/22 12:54 PM  Result Value Ref Range   Glucose-Capillary 110 (H) 70 - 99 mg/dL    Comment: Glucose reference range applies only to samples taken after fasting for at least 8 hours.  Urinalysis, Routine w reflex microscopic Urine, Clean Catch     Status: Abnormal   Collection Time: 04/14/22  1:20 PM  Result Value Ref Range   Color, Urine STRAW (A) YELLOW   APPearance CLEAR CLEAR   Specific Gravity, Urine 1.006 1.005 - 1.030   pH 5.0 5.0 - 8.0   Glucose, UA NEGATIVE NEGATIVE mg/dL   Hgb urine dipstick NEGATIVE NEGATIVE   Bilirubin Urine NEGATIVE NEGATIVE   Ketones, ur NEGATIVE NEGATIVE mg/dL   Protein, ur NEGATIVE NEGATIVE mg/dL   Nitrite NEGATIVE NEGATIVE   Leukocytes,Ua NEGATIVE NEGATIVE    Comment: Performed at [facility], [address] [place], [place] [address]  Creatinine, urine, random     Status: None   Collection Time: 04/14/22  1:20 PM  Result Value Ref Range   Creatinine, Urine 45 mg/dL    Comment: Performed at [facility], [address], [place], [place] [address]  Sodium, urine, random     Status: None   Collection Time: 04/14/22  1:20 PM  Result Value Ref Range   Sodium, Ur 29 mmol/L    Comment: Performed at [facility], [address] [place], [place] [address]  CBG monitoring, ED     Status: Abnormal   Collection Time: 04/14/22  3:13 PM  Result Value Ref Range   Glucose-Capillary 67 (L) 70 - 99 mg/dL    Comment: Glucose reference range applies only to samples taken after fasting for at least  8 hours.   US RENAL  Result Date: 04/14/2022 CLINICAL DATA:  Acute kidney injury. EXAM: RENAL / URINARY TRACT ULTRASOUND COMPLETE COMPARISON:  March 04, 2016. FINDINGS: Right Kidney: Renal measurements: 10.3 x 4.4 x 4.0 cm = volume: 96 mL. Increased echogenicity of renal parenchyma is noted suggesting medical renal disease. No mass or hydronephrosis visualized. Left Kidney: Renal measurements: 10.4 x 4.8 x 4.1 cm = volume: 106 mL. Increased echogenicity of renal parenchyma is noted suggesting medical renal disease. No mass or hydronephrosis visualized. Bladder: Appears normal for degree of bladder distention. Ureteral jets are not visualized. Other: Probable mild splenomegaly is noted. IMPRESSION: Increased echogenicity of renal parenchyma is noted bilaterally suggesting medical renal disease. No hydronephrosis or renal obstruction is noted. Probable mild splenomegaly. Electronically Signed   By: Lupita Raider M.D.   On: 04/14/2022 15:20   DG Chest 2 View  Result Date: 04/14/2022 CLINICAL DATA:  Provided history: Chest pain. EXAM: CHEST - 2 VIEW COMPARISON:  Prior chest radiographs 03/04/2016 and earlier. FINDINGS: Heart size within normal limits. Small bandlike opacity within the left lung base, with an appearance favoring atelectasis. No appreciable airspace consolidation on the right. No evidence of pleural effusion or pneumothorax. Degenerative changes of the spine. IMPRESSION: Small bandlike opacity within the left lung base. The appearance favors atelectasis. However, pneumonia is difficult to exclude. Correlate clinically and consider short-interval radiographic follow-up. Electronically Signed   By: Jackey Loge D.O.   On: 04/14/2022 08:39    Pending Labs [employer] (From admission, onward)     Start  Ordered   04/15/22 0500  Basic metabolic panel  Tomorrow morning,   R        04/14/22 1332   04/15/22 0500  CBC  Tomorrow morning,   R        04/14/22 1332   04/14/22 1530   D-dimer, quantitative  Once,   R        04/14/22 1530   04/14/22 1418  Protein / creatinine ratio, urine  Once,   R        04/14/22 1417   04/14/22 1418  Urinalysis, Complete w Microscopic  Once,   R        04/14/22 1417   04/14/22 1405  Basic metabolic panel  Once,   AD       Comments: Previous potassium hemolyzed.  This is an intentional re-order for a new sample    04/14/22 1405            Vitals/Pain Today's Vitals   04/14/22 1325 04/14/22 1445 04/14/22 1456 04/14/22 1500  BP: (!) 134/93 119/81  126/79  Pulse: 62 72 72 78  Resp: 17 (!) 23 15 15   Temp:      SpO2: 100% 97% 100% 99%  Weight:      Height:      PainSc:        Isolation Precautions No active isolations  Medications Medications  heparin injection 5,000 Units (5,000 Units Subcutaneous Not Given 04/14/22 1343)  0.9 %  sodium chloride infusion ( Intravenous New Bag/Given 04/14/22 1455)  hydrALAZINE (APRESOLINE) tablet 25 mg (has no administration in time range)  sodium zirconium cyclosilicate (LOKELMA) packet 10 g (has no administration in time range)  sodium chloride 0.9 % bolus 1,000 mL (0 mLs Intravenous Stopped 04/14/22 1340)  calcium gluconate 1 g/ 50 mL sodium chloride IVPB (0 mg Intravenous Stopped 04/14/22 1253)  furosemide (LASIX) injection 40 mg (40 mg Intravenous Given 04/14/22 1222)  sodium zirconium cyclosilicate (LOKELMA) packet 10 g (10 g Oral Given 04/14/22 1225)  insulin aspart (novoLOG) injection 5 Units (5 Units Intravenous Given 04/14/22 1321)    And  dextrose 50 % solution 50 mL (50 mLs Intravenous Given 04/14/22 1321)  sodium bicarbonate injection 50 mEq (50 mEq Intravenous Given 04/14/22 1323)  sodium chloride 0.9 % bolus 1,000 mL (0 mLs Intravenous Stopped 04/14/22 1450)    Mobility walks Low fall risk   Focused Assessments    R Recommendations: See Admitting Provider Note  Report given to:   Additional Notes:

## 2022-04-14 NOTE — H&P (Signed)
History and Physical    Joshua Wagner ZOX:[id number] DOB: 11-Sep-1970 DOA: 04/14/2022  PCP: Lula Olszewski, MD (Confirm with patient/family/NH records and if not entered, this has to be entered at [facility] point of entry) Patient coming from: Home  I have personally briefly reviewed patient's old medical records in [facility]  Chief Complaint: Feeling ok  HPI: Joshua Wagner is a 51 y.o. male with medical history significant of ITP, sent from PCPs office for evaluation of AKI and hyperkalemia.  2 weeks ago, patient sustained a right calf dull injury when In the middle of the night and feeling severe pain of right calf which he thought might be ankle twist, unable to stand on right foot, he came to ED.  MRI showed gastrocnemius myofascial tear, and orthopedic surgery recommended short leg splint and crutch and patient was sent home.  On same ED visit, his blood reading 205 systolic, and patient was told to follow-up with PCP for elevated blood pressure reading.  2 days later, patient came back to ED complaining about headache and again his blood pressure was found to be elevated with 190 systolic, blood work showed creatinine 1.9 compared to baseline 1.0 several years ago.  Patient was discharged home with amlodipine and benazepril.  Patient has had occasional blood pressure readings at home most occasions systolic blood pressure 120-130s and denies any lightheadedness weakness.  Yesterday, prevention but back to PCPs office for routine follow-up, blood work showed creatinine 3.0K 7.0 and patient was told to come to ED for further evaluation.  Patient did not notice any changes of urine output or color changes and denied any back pain.  His oral intake has been as normal and denies any diarrhea.  ED Course: Blood pressure borderline low with systolic 100s, no tachycardia afebrile.  BMP again showed K7.0, creatinine 0.0.  Platelet 113 which is about his baseline.  Was given IV bolus and Lasix times  1+ hyperkalemia cocktail including dextrose and insulin plus calcium gluconate, and Lokelma  Review of Systems: As per HPI otherwise 14 point review of systems negative.    Past Medical History:  Diagnosis Date   Chest discomfort 04/13/2022   History of intermittent mild chest discomfort and poor cholesterol and uncontrolled hypertension   Hyperbilirubinemia    Multiple lung nodules 08/11/2016   Nephrolithiasis 08/11/2016   Sepsis (HCC)    Splenic lesion 05/03/2016   Splenomegaly 05/03/2016   Thrombocytopenia, idiopathic (HCC)     Past Surgical History:  Procedure Laterality Date   TEE WITHOUT CARDIOVERSION N/A 03/08/2016   Procedure: TRANSESOPHAGEAL ECHOCARDIOGRAM (TEE);  Surgeon: Quintella Reichert, MD;  Location: [facility] ENDOSCOPY;  Service: Cardiovascular;  Laterality: N/A;     reports that he has never smoked. He has never used smokeless tobacco. He reports that he does not drink alcohol and does not use drugs.  Allergies  Allergen Reactions   Bactrim [Sulfamethoxazole-Trimethoprim] Other (See Comments)    Jaundice & elevated bilirubin - see admission November 2017 for details   Advil [Ibuprofen]     Due to ITP.   Penicillins    Sulfa Antibiotics    Latex Rash    Family History  Problem Relation Age of Onset   Diabetes Mother    Diabetes Father    Diabetes Maternal Grandfather    Heart disease Maternal Grandfather    Diabetes Brother     Prior to Admission medications   Medication Sig Start Date End Date Taking? Authorizing Provider  amLODipine (NORVASC) 5 MG  tablet Take 1 tablet (5 mg total) by mouth daily. 04/13/22  Yes Loralee Pacas, MD  benazepril (LOTENSIN) 10 MG tablet Take 4 tablets (40 mg total) by mouth daily. 04/13/22  Yes Loralee Pacas, MD  Multiple Vitamin (MULTIVITAMIN) tablet Take 1 tablet by mouth daily.   Yes [provider]    Physical Exam: Vitals:   04/14/22 1136 04/14/22 1220 04/14/22 1245 04/14/22 1325  BP: 112/72 105/77 (!)  115/95 (!) 134/93  Pulse: 75 63 61 62  Resp: 18 18 14 17   Temp: 97.9 F (36.6 C)     SpO2: 97% 100% 90% 100%  Weight:      Height:        Constitutional: NAD, calm, comfortable Vitals:   04/14/22 1136 04/14/22 1220 04/14/22 1245 04/14/22 1325  BP: 112/72 105/77 (!) 115/95 (!) 134/93  Pulse: 75 63 61 62  Resp: 18 18 14 17   Temp: 97.9 F (36.6 C)     SpO2: 97% 100% 90% 100%  Weight:      Height:       Eyes: PERRL, lids and conjunctivae normal ENMT: Mucous membranes are moist. Posterior pharynx clear of any exudate or lesions.Normal dentition.  Neck: normal, supple, no masses, no thyromegaly Respiratory: clear to auscultation bilaterally, no wheezing, no crackles. Normal respiratory effort. No accessory muscle use.  Cardiovascular: Regular rate and rhythm, no murmurs / rubs / gallops. No extremity edema. 2+ pedal pulses. No carotid bruits.  Abdomen: no tenderness, no masses palpated. No hepatosplenomegaly. Bowel sounds positive.  Musculoskeletal: no clubbing / cyanosis. No joint deformity upper and lower extremities. Good ROM, no contractures. Normal muscle tone. Right short leg splinter in place, no significant peripheral edema Skin: no rashes, lesions, ulcers. No induration Neurologic: CN 2-12 grossly intact. Sensation intact, DTR normal. Strength 5/5 in all 4.  Psychiatric: Normal judgment and insight. Alert and oriented x 3. Normal mood.     Labs on Admission: I have personally reviewed following labs and imaging studies  CBC: Recent Labs  Lab 04/13/22 0937 04/14/22 0817  WBC 6.3 5.5  NEUTROABS 4.1  --   HGB 15.4 14.7  HCT 44.7 42.4  MCV 78.7 78.1*  PLT 120.0* 123456*   Basic Metabolic Panel: Recent Labs  Lab 04/13/22 1621 04/14/22 0817 04/14/22 1228  NA 131* 131*  --   K 7.4* 7.0*  --   CL 102 102  --   CO2 21 21*  --   GLUCOSE 98 106*  --   BUN 80* 77*  --   CREATININE 3.13* 3.07*  --   CALCIUM 9.3 9.2  --   MG 3.4*  --  3.1*  PHOS 5.6*  --   --     GFR: Estimated Creatinine Clearance: 30.7 mL/min (A) (by C-G formula based on SCr of 3.07 mg/dL (H)). Liver Function Tests: Recent Labs  Lab 04/13/22 1621  AST 25  ALT 39  BILITOT 0.5  PROT 7.7   No results for input(s): "LIPASE", "AMYLASE" in the last 168 hours. No results for input(s): "AMMONIA" in the last 168 hours. Coagulation Profile: No results for input(s): "INR", "PROTIME" in the last 168 hours. Cardiac Enzymes: No results for input(s): "CKTOTAL", "CKMB", "CKMBINDEX", "TROPONINI" in the last 168 hours. BNP (last 3 results) No results for input(s): "PROBNP" in the last 8760 hours. HbA1C: No results for input(s): "HGBA1C" in the last 72 hours. CBG: Recent Labs  Lab 04/14/22 1254  GLUCAP 110*   Lipid Profile: Recent Labs  04/13/22 1621  CHOL 182  HDL 45  LDLCALC 113*  TRIG 125  CHOLHDL 4.0   Thyroid Function Tests: No results for input(s): "TSH", "T4TOTAL", "FREET4", "T3FREE", "THYROIDAB" in the last 72 hours. Anemia Panel: No results for input(s): "VITAMINB12", "FOLATE", "FERRITIN", "TIBC", "IRON", "RETICCTPCT" in the last 72 hours. Urine analysis:    Component Value Date/Time   COLORURINE STRAW (A) 04/14/2022 1320   APPEARANCEUR CLEAR 04/14/2022 1320   LABSPEC 1.006 04/14/2022 1320   PHURINE 5.0 04/14/2022 1320   GLUCOSEU NEGATIVE 04/14/2022 1320   GLUCOSEU NEGATIVE 04/13/2022 0937   HGBUR NEGATIVE 04/14/2022 1320   BILIRUBINUR NEGATIVE 04/14/2022 1320   BILIRUBINUR neg 04/05/2022 0953   KETONESUR NEGATIVE 04/14/2022 1320   PROTEINUR NEGATIVE 04/14/2022 1320   UROBILINOGEN 0.2 04/13/2022 0937   NITRITE NEGATIVE 04/14/2022 1320   LEUKOCYTESUR NEGATIVE 04/14/2022 1320    Radiological Exams on Admission: DG Chest 2 View  Result Date: 04/14/2022 CLINICAL DATA:  Provided history: Chest pain. EXAM: CHEST - 2 VIEW COMPARISON:  Prior chest radiographs 03/04/2016 and earlier. FINDINGS: Heart size within normal limits. Small bandlike opacity within  the left lung base, with an appearance favoring atelectasis. No appreciable airspace consolidation on the right. No evidence of pleural effusion or pneumothorax. Degenerative changes of the spine. IMPRESSION: Small bandlike opacity within the left lung base. The appearance favors atelectasis. However, pneumonia is difficult to exclude. Correlate clinically and consider short-interval radiographic follow-up. Electronically Signed   By: Kellie Simmering D.O.   On: 04/14/2022 08:39    EKG: Independently reviewed.  Sinus, no significant ST, or T wave changes compared to before  Assessment/Plan Principal Problem:   Hyperkalemia Active Problems:   Thrombocytopenia, idiopathic (HCC)   AKI (acute kidney injury) ([place])  (please populate well all problems here in Problem List. (For example, if patient is on BP meds at home and you resume or decide to hold them, it is a problem that needs to be her. Same for CAD, COPD, HLD and so on)  AKI -Probably related to BP meds started recently. -Decided to continue high rate of IV fluid today -Renal ultrasound for any obstructions -UA pending, received Lasix this morning, will no order FeNa study  Hyperkalemia -Acute, no T wave changes on EKG -Likely from AKI and ACEI -Received hyperkalemia cocktail, recheck BMP pending -No indication for emergent hemodialysis right now.  Nephrology on board.  HTN -Hold off all home BP meds -PRN Hydralazine for now  Recent myofascial tear on right gastrocnemius -On short leg splinter, outpatient F/U with ortho  Chest pain -Reported intermittent sharp like chest pain, centrally located, nonradiating not related to activity.  Troponin negative x 2, no ST changes on EKG, ACS unlikely. -Check D dimer and DVT study  ITP -PLT reading stable, no acute concerns.  DVT prophylaxis: Heparin subQ Code Status: Full Code Family Communication: Wife at bedside Disposition Plan: Expect less than 2 midnight hospital stay, unless there  is no significant improvement of AKI and/or hyperkalemia. Consults called: Nephrology Admission status: Tele obs   Lequita Halt MD [facility] Pager [phone]  04/14/2022, 1:42 PM

## 2022-04-14 NOTE — ED Notes (Signed)
Pt has right foot in boot

## 2022-04-15 ENCOUNTER — Encounter (HOSPITAL_COMMUNITY): Payer: Self-pay | Admitting: Internal Medicine

## 2022-04-15 ENCOUNTER — Other Ambulatory Visit (HOSPITAL_COMMUNITY): Payer: Self-pay

## 2022-04-15 ENCOUNTER — Observation Stay (HOSPITAL_COMMUNITY): Payer: BC Managed Care – PPO

## 2022-04-15 DIAGNOSIS — S86811D Strain of other muscle(s) and tendon(s) at lower leg level, right leg, subsequent encounter: Secondary | ICD-10-CM | POA: Diagnosis not present

## 2022-04-15 DIAGNOSIS — N189 Chronic kidney disease, unspecified: Secondary | ICD-10-CM | POA: Diagnosis not present

## 2022-04-15 DIAGNOSIS — I10 Essential (primary) hypertension: Secondary | ICD-10-CM | POA: Diagnosis not present

## 2022-04-15 DIAGNOSIS — E875 Hyperkalemia: Secondary | ICD-10-CM | POA: Diagnosis not present

## 2022-04-15 DIAGNOSIS — D693 Immune thrombocytopenic purpura: Secondary | ICD-10-CM | POA: Diagnosis not present

## 2022-04-15 DIAGNOSIS — N179 Acute kidney failure, unspecified: Secondary | ICD-10-CM | POA: Diagnosis not present

## 2022-04-15 LAB — BASIC METABOLIC PANEL
Anion gap: 6 (ref 5–15)
BUN: 56 mg/dL — ABNORMAL HIGH (ref 6–20)
CO2: 24 mmol/L (ref 22–32)
Calcium: 8.4 mg/dL — ABNORMAL LOW (ref 8.9–10.3)
Chloride: 108 mmol/L (ref 98–111)
Creatinine, Ser: 2.26 mg/dL — ABNORMAL HIGH (ref 0.61–1.24)
GFR, Estimated: 34 mL/min — ABNORMAL LOW (ref >60)
Glucose, Bld: 95 mg/dL (ref 70–99)
Potassium: 5 mmol/L (ref 3.5–5.1)
Sodium: 138 mmol/L (ref 135–145)

## 2022-04-15 LAB — HIV ANTIBODY (ROUTINE TESTING W REFLEX): HIV 1&2 Ab, 4th Generation: NONREACTIVE

## 2022-04-15 LAB — CBC
HCT: 35.6 % — ABNORMAL LOW (ref 39.0–52.0)
Hemoglobin: 12.2 g/dL — ABNORMAL LOW (ref 13.0–17.0)
MCH: 27 pg (ref 26.0–34.0)
MCHC: 34.3 g/dL (ref 30.0–36.0)
MCV: 78.8 fL — ABNORMAL LOW (ref 80.0–100.0)
Platelets: 72 10*3/uL — ABNORMAL LOW (ref 150–400)
RBC: 4.52 MIL/uL (ref 4.22–5.81)
RDW: 13 % (ref 11.5–15.5)
WBC: 3.7 10*3/uL — ABNORMAL LOW (ref 4.0–10.5)
nRBC: 0 % (ref 0.0–0.2)

## 2022-04-15 LAB — CYSTATIN C WITH GLOMERULAR FILTRATION RATE, ESTIMATED (EGFR)
CYSTATIN C: 2.87 mg/L — ABNORMAL HIGH (ref 0.52–1.23)
eGFR: 20 mL/min/{1.73_m2} — ABNORMAL LOW (ref >=60)

## 2022-04-15 LAB — HEPATITIS C ANTIBODY: Hepatitis C Ab: NONREACTIVE

## 2022-04-15 MED ORDER — ACETAMINOPHEN 325 MG PO TABS: 650.0000 mg | ORAL_TABLET | Freq: Four times a day (QID) | ORAL | Status: DC | PRN

## 2022-04-15 MED ORDER — SODIUM ZIRCONIUM CYCLOSILICATE 10 G PO PACK
10.0000 g | PACK | Freq: Once | ORAL | Status: AC
  Administered 2022-04-15: 10 g via ORAL
  Filled 2022-04-15: qty 1

## 2022-04-15 MED ORDER — AMLODIPINE BESYLATE 10 MG PO TABS
10.0000 mg | ORAL_TABLET | Freq: Every day | ORAL | Status: DC
  Administered 2022-04-15: 10 mg via ORAL
  Filled 2022-04-15: qty 1

## 2022-04-15 MED ORDER — AMLODIPINE BESYLATE 10 MG PO TABS
10.0000 mg | ORAL_TABLET | Freq: Every day | ORAL | 0 refills | Status: DC
  Filled 2022-04-15: qty 30, 30d supply, fill #0

## 2022-04-15 NOTE — Assessment & Plan Note (Addendum)
Hyperkalemia.  Patient has responded well to medical therapy.  Follow up renal function with serum cr at 2.26 with K at 5,0 and serum bicarbonate at 24.  Na 138 and Cl 108.   Renal US with no hydronephrosis but with increased echogenicity of the renal parenchyma bilaterally.  Before his discharge he received 10 g of sodium zirconium.  Patient likely has chronic kidney disease stage 3a or 3b.   Plan to continue blood pressure control with amlodipine 10 mg. Hold on ace inh or ARB for now. Follow up renal function and electrolytes as outpatient.

## 2022-04-15 NOTE — Discharge Summary (Addendum)
Physician Discharge Summary   Patient: Joshua Wagner MRN: [id number] DOB: 06/06/1970  Admit date:     04/14/2022  Discharge date: 04/15/22  Discharge Physician: York RamMauricio Daniel Karsen Wagner   PCP: Joshua Wagner, Joshua Wagner   Recommendations at discharge:   Discontinue benazepril due to AKI and hyperkalemia. Continue blood pressure control with amlodipine 10 mg daily.  Outpatient follow up renal function and electrolytes in 7 days.  Follow up with Dr Joshua Wagner in 7 to 10 days. Follow up with nephrology as scheduled.  Avoid non steroidal anti inflammatory agents. Avoid high potassium foods.   Discharge Diagnoses: Active Problems:   Acute kidney injury superimposed on chronic kidney disease (HCC)   Hypertension   Thrombocytopenia, idiopathic (HCC)   Strain of calf muscle  Resolved Problems:   * No resolved hospital problems. Joshua Wagner*  Hospital Course: Mr. Joshua Wagner was admitted to the hospital with the working diagnosis of hyperkalemia.   51 yo male with the past medical history of ITP, who was referred to the ED by his primary care provider due to hyperkalemia and acute kidney injury. About 2 weeks ago patient was diagnosed with traumatic right gastrocnemius myofacial tear, treated with short leg splint and crutches. His blood pressure was noted to be elevated then and at a second visit to the ED, systolic 205 to 190 mmHg with positive headaches. He was placed on amlodipine and benazepril and instructed to follow up with primary care. Out patient follow up renal function showed serum cr 3.0 and K of 7,0 that prompted a referral back to the ED.  On his initial physical examination his blood pressure was 112/72, HR 75, RR 19 and 02 saturation 97%, lungs with no rales or wheezing, heart with S1 and S2 present and rhythmic, abdomen with no distention, right lower extremity with splinter in place with no significant edema.   Na 131, K 6,1, CL 104 bicarbonate 13 glucose 102 bun 77, cr 2,81  Mg 3.1  CK  123 Wbc 5.5 hgb 14.7 plt 113  Urine analysis SG 1,006, pH 5.0, negative protein.   Chest radiograph with no cardiomegaly, no infiltrates, bibasilar atelectasis.   EKG 79 bpm, normal axis, normal intervals, sinus rhythm with small q wave led II, III, AvF, J point elevation V2 and V3, no significant ST segment or T wave changes. Positive LVH.   Patient was placed on IV fluids, and furosemide.  Hyperkalemia treatment with insulin, dextrose and calcium gluconate IV.  Sodium zirconium oral. His renal function and K has improved Patient will continue blood pressure control with dihydropyridine calcium channel blocker and have renal function and blood pressure follow up as outpatient.      Assessment and Plan: Acute kidney injury superimposed on chronic kidney disease (HCC) Hyperkalemia.  Patient has responded well to medical therapy.  Follow up renal function with serum cr at 2.26 with K at 5,0 and serum bicarbonate at 24.  Na 138 and Cl 108.   Renal US with no hydronephrosis but with increased echogenicity of the renal parenchyma bilaterally.  Before his discharge he received 10 g of sodium zirconium.  Patient likely has chronic kidney disease stage 3a or 3b.   Plan to continue blood pressure control with amlodipine 10 mg. Hold on ace inh or ARB for now. Follow up renal function and electrolytes as outpatient.   Hypertension Patient will continue blood pressure control with amlodipine 10 mg and have close follow up as outpatient.  Likely he has long standing hypertension.  Thrombocytopenia, idiopathic (HCC) Pancytopenia.  At the time of his discharge his wbc is 3.7, hgb 12.2 and plt 72. Plan to follow up cell count as outpatient.   Strain of calf muscle Right myofacial tear involving the medial head gastrocnemius with extensive intramuscular edema and a crescentic hematoma along the outer surface measuring 2,9 x 8,7 cm in the axial and 20,4 cm in craniocaudal extent.   Lower  extremities doppler US with right age indeterminate superficial vein thrombosis involving the right small saphenous vein.  No DVT on the right or left lower extremities.   Continue outpatient PT and follow up with Orthopedics.          Consultants: nephrology  Procedures performed: none   Disposition: Home Diet recommendation:  Cardiac diet low potassium  DISCHARGE MEDICATION: Allergies as of 04/15/2022       Reactions   Bactrim [sulfamethoxazole-trimethoprim] Other (See Comments)   Jaundice & elevated bilirubin - see admission November 2017 for details   Advil [ibuprofen]    Due to ITP.   Penicillins    Sulfa Antibiotics    Latex Rash        Medication List     STOP taking these medications    benazepril 10 MG tablet Commonly known as: LOTENSIN       TAKE these medications    acetaminophen 325 MG tablet Commonly known as: TYLENOL Take 2 tablets (650 mg total) by mouth every 6 (six) hours as needed for moderate pain.   amLODipine 10 MG tablet Commonly known as: NORVASC Take 1 tablet (10 mg total) by mouth daily. What changed:  medication strength how much to take   multivitamin tablet Take 1 tablet by mouth daily.        Discharge Exam: Filed Weights   04/14/22 0806 04/14/22 1802  Weight: 86.2 kg 86.7 kg   BP (!) 143/84 (BP Location: Right Arm)   Pulse 70   Temp (!) 97.5 F (36.4 C) (Oral)   Resp 18   Ht 5\' 11"  (1.803 m)   Wt 86.7 kg   SpO2 100%   BMI 26.66 kg/m   Patient with no chest pain or dyspnea.  Neurology awake and alert ENT with no pallor Cardiovascular with S1 and S2 present and rhythmic with no gallops, rubs or murmurs Respiratory with no rales or wheezing Abdomen with no distention  Right lower extremity with wraps in place, no left lower extremity edema   Condition at discharge: stable  The results of significant diagnostics from this hospitalization (including imaging, microbiology, ancillary and laboratory) are  listed below for reference.   Imaging Studies: VAS LOWER EXTREMITY VENOUS (DVT)  Result Date: 04/15/2022  Lower Venous DVT Study Patient Name:  Joshua Wagner  Date of Exam:   04/15/2022 Medical Rec #: 04/17/2022      Accession #:    [id number] Date of Birth: 05-Apr-1971     Patient Gender: M Patient Age:   51 years Exam Location:  [facility] Procedure:      VAS [facility] LOWER EXTREMITY VENOUS (DVT) Referring Phys: [place] --------------------------------------------------------------------------------  Indications: Pain, Swelling, and Edema.  Comparison Study: no prior Performing Technologist: Mikey [employer] RVS  Examination Guidelines: A complete evaluation includes B-mode imaging, spectral Doppler, color Doppler, and power Doppler as needed of all accessible portions of each vessel. Bilateral testing is considered an integral part of a complete examination. Limited examinations for reoccurring indications may be performed as noted. The reflux portion of the exam  is performed with the patient in reverse Trendelenburg.  +---------+---------------+---------+-----------+----------+-----------------+ RIGHT    CompressibilityPhasicitySpontaneityPropertiesThrombus Aging    +---------+---------------+---------+-----------+----------+-----------------+ CFV      Full           Yes      Yes                                    +---------+---------------+---------+-----------+----------+-----------------+ SFJ      Full                                                           +---------+---------------+---------+-----------+----------+-----------------+ FV Prox  Full                                                           +---------+---------------+---------+-----------+----------+-----------------+ FV Mid   Full                                                           +---------+---------------+---------+-----------+----------+-----------------+ FV DistalFull                                                            +---------+---------------+---------+-----------+----------+-----------------+ PFV      Full                                                           +---------+---------------+---------+-----------+----------+-----------------+ POP      Full           Yes      Yes                                    +---------+---------------+---------+-----------+----------+-----------------+ PTV      Full                                                           +---------+---------------+---------+-----------+----------+-----------------+ PERO     Full                                                           +---------+---------------+---------+-----------+----------+-----------------+ SSV      Partial  Yes      Yes                  Age Indeterminate +---------+---------------+---------+-----------+----------+-----------------+   +----+---------------+---------+-----------+----------+--------------+ LEFTCompressibilityPhasicitySpontaneityPropertiesThrombus Aging +----+---------------+---------+-----------+----------+--------------+ CFV Full           Yes      Yes                                 +----+---------------+---------+-----------+----------+--------------+    Summary: RIGHT: - Findings consistent with age indeterminate superficial vein thrombosis involving the right small saphenous vein. - There is no evidence of deep vein thrombosis in the lower extremity.  - No cystic structure found in the popliteal fossa.  LEFT: - No evidence of common femoral vein obstruction.  *See table(s) above for measurements and observations.    Preliminary    US RENAL  Result Date: 04/14/2022 CLINICAL DATA:  Acute kidney injury. EXAM: RENAL / URINARY TRACT ULTRASOUND COMPLETE COMPARISON:  March 04, 2016. FINDINGS: Right Kidney: Renal measurements: 10.3 x 4.4 x 4.0 cm = volume: 96 mL. Increased echogenicity of renal parenchyma is  noted suggesting medical renal disease. No mass or hydronephrosis visualized. Left Kidney: Renal measurements: 10.4 x 4.8 x 4.1 cm = volume: 106 mL. Increased echogenicity of renal parenchyma is noted suggesting medical renal disease. No mass or hydronephrosis visualized. Bladder: Appears normal for degree of bladder distention. Ureteral jets are not visualized. Other: Probable mild splenomegaly is noted. IMPRESSION: Increased echogenicity of renal parenchyma is noted bilaterally suggesting medical renal disease. No hydronephrosis or renal obstruction is noted. Probable mild splenomegaly. Electronically Signed   By: Lupita Raider M.D.   On: 04/14/2022 15:20   DG Chest 2 View  Result Date: 04/14/2022 CLINICAL DATA:  Provided history: Chest pain. EXAM: CHEST - 2 VIEW COMPARISON:  Prior chest radiographs 03/04/2016 and earlier. FINDINGS: Heart size within normal limits. Small bandlike opacity within the left lung base, with an appearance favoring atelectasis. No appreciable airspace consolidation on the right. No evidence of pleural effusion or pneumothorax. Degenerative changes of the spine. IMPRESSION: Small bandlike opacity within the left lung base. The appearance favors atelectasis. However, pneumonia is difficult to exclude. Correlate clinically and consider short-interval radiographic follow-up. Electronically Signed   By: Jackey Loge D.O.   On: 04/14/2022 08:39   MR TIBIA FIBULA RIGHT WO CONTRAST  Result Date: 04/01/2022 CLINICAL DATA:  Achilles tendon trauma or laceration EXAM: MRI OF LOWER RIGHT EXTREMITY WITHOUT CONTRAST TECHNIQUE: Multiplanar, multisequence MR imaging of the right lower extremity was performed. No intravenous contrast was administered. COMPARISON:  None Available. FINDINGS: Bones/Joint/Cartilage There is no evidence of acute fracture. There is mild reactive marrow edema along the posterior proximal tibia, nonspecific. There is no focal bone lesion. There is a partially  visualized posterior fossa cyst of the left knee. Ligaments The interosseous membrane is unremarkable. Muscles and Tendons There is intramuscular edema within the medial head gastrocnemius, with crescentic heterogeneous fluid collection along the superficial surface measuring up to 2.9 cm short axis, 8.7 cm in length, and extending along nearly the entire course of the medial gastrocnemius muscle 20.4 cm in craniocaudal extent (series 16, image 28, series 11, image 18). There is no well-defined intramuscular collection. The superior medial aspect of the superficial fascia is not well-defined and there is the appearance of some disorganized muscle fibers along the superomedial aspect. The anterior gastrocnemius aponeurosis is unremarkable without focal fluid collection. The lateral gastrocnemius  is unremarkable. The underlying soleus muscle demonstrates minimal intramuscular edema. The anterior compartment is unremarkable. The Achilles tendon is intact. Soft tissues There is superficial soft tissue swelling along the lower extremity. IMPRESSION: Findings suggestive of myofascial tear involving the medial head gastrocnemius, with extensive intramuscular edema and a crescentic hematoma along the outer surface measuring 2.9 x 8.7 cm in the axial dimension and 20.4 cm in craniocaudal extent. Minimal reactive intramuscular edema in the soleus muscle. Intact Achilles tendon. No acute osseous abnormality. Electronically Signed   By: Caprice Renshaw M.D.   On: 04/01/2022 08:18    Microbiology: Results for orders placed or performed during the hospital encounter of 03/04/16  Blood culture (routine x 2)     Status: None   Collection Time: 03/04/16 12:21 PM   Specimen: BLOOD  Result Value Ref Range Status   Specimen Description BLOOD RIGHT ANTECUBITAL  Final   Special Requests BOTTLES DRAWN AEROBIC AND ANAEROBIC  Final   Culture NO GROWTH 5 DAYS  Final   Report Status 03/09/2016 FINAL  Final  Blood culture (routine  x 2)     Status: None   Collection Time: 03/04/16  1:03 PM   Specimen: BLOOD RIGHT HAND  Result Value Ref Range Status   Specimen Description BLOOD RIGHT HAND  Final   Special Requests BOTTLES DRAWN AEROBIC AND ANAEROBIC 5CC  Final   Culture NO GROWTH 5 DAYS  Final   Report Status 03/09/2016 FINAL  Final  Culture, blood (routine x 2)     Status: None   Collection Time: 03/07/16  6:41 AM   Specimen: BLOOD  Result Value Ref Range Status   Specimen Description BLOOD RIGHT ANTECUBITAL  Final   Special Requests BOTTLES DRAWN AEROBIC AND ANAEROBIC 10CC  Final   Culture NO GROWTH 5 DAYS  Final   Report Status 03/12/2016 FINAL  Final  Culture, blood (routine x 2)     Status: None   Collection Time: 03/07/16  6:46 AM   Specimen: BLOOD  Result Value Ref Range Status   Specimen Description BLOOD LEFT ANTECUBITAL  Final   Special Requests BOTTLES DRAWN AEROBIC AND ANAEROBIC 10CC  Final   Culture NO GROWTH 5 DAYS  Final   Report Status 03/12/2016 FINAL  Final    Labs: CBC: Recent Labs  Lab 04/13/22 0937 04/14/22 0817 04/15/22 0548  WBC 6.3 5.5 3.7*  NEUTROABS 4.1  --   --   HGB 15.4 14.7 12.2*  HCT 44.7 42.4 35.6*  MCV 78.7 78.1* 78.8*  PLT 120.0* 113* 72*   Basic Metabolic Panel: Recent Labs  Lab 04/13/22 1621 04/14/22 0817 04/14/22 1228 04/14/22 1510 04/14/22 1920 04/15/22 0548  NA 131* 131* 131* 136  --  138  K 7.4* 7.0* 6.1* 4.6 4.7 5.0  CL 102 102 104 103  --  108  CO2 21 21* 13* 24  --  24  GLUCOSE 98 106* 102* 68*  --  95  BUN 80* 77* 77* 70*  --  56*  CREATININE 3.13* 3.07* 2.81* 2.70*  --  2.26*  CALCIUM 9.3 9.2 8.7* 8.7*  --  8.4*  MG 3.4*  --  3.1*  --   --   --   PHOS 5.6*  --   --   --   --   --    Liver Function Tests: Recent Labs  Lab 04/13/22 1621  AST 25  ALT 39  BILITOT 0.5  PROT 7.7   CBG: Recent Labs  Lab 04/14/22 1254 04/14/22 1513 04/14/22 1633  GLUCAP 110* 67* 130*    Discharge time spent: greater than 30  minutes.  Signed: Coralie Keens, [facility] 04/15/2022

## 2022-04-15 NOTE — Progress Notes (Signed)
Admit: 04/14/2022 LOS: 0  26M with AKI and Hyperkalemia after starting ACEi  Subjective:  K down to 4.7 to 5.0 SCr down to 2.26 Feels well Was eating quite a bit of high K Foods  Renal US with inc echogenicity b/l  12/14 0701 - 12/15 0700 In: 1233.4 [P.O.:120; I.V.:1113.4] Out: 825 [Urine:825]  Filed Weights   04/14/22 0806 04/14/22 1802  Weight: 86.2 kg 86.7 kg    Scheduled Meds:  amLODipine  10 mg Oral Daily   heparin  5,000 Units Subcutaneous Q12H   Continuous Infusions: PRN Meds:.  Current Labs: reviewed    Physical Exam:  Blood pressure (!) 143/84, pulse 70, temperature (!) 97.5 F (36.4 C), temperature source Oral, resp. rate 18, height 5\' 11"  (1.803 m), weight 86.7 kg, SpO2 100 %. NAD RRR CTAB S/nt/nd NO LEE  A AKI,  2019 normal SCr but was 1.9 on 04/03/22, peaked at 3.13 2/13, is 2.26 today Appears nonoliguric Likelly was hypovolemia + ACEi Hyperkalemia, severe, resolved 2/2 #1 + use of ACEi HTN:BPs farily stable here since admission  P OK for DC Discussed high K foods to avoid Do not take ACEi Will have f/u labs early next week and see Dr. 3/13 soon thereafter He will do home BP monitoring and if ssustained BPs > 140 to 150 can resume amlodipine Further BP mgmt as outpt Medication Issues; Preferred narcotic agents for pain control are hydromorphone, fentanyl, and methadone. Morphine should not be used.  Baclofen should be avoided Avoid oral sodium phosphate and magnesium citrate based laxatives / bowel preps    Malen Gauze MD 04/15/2022, 12:18 PM  Recent Labs  Lab 04/13/22 1621 04/14/22 0817 04/14/22 1228 04/14/22 1510 04/14/22 1920 04/15/22 0548  NA 131*   < > 131* 136  --  138  K 7.4*   < > 6.1* 4.6 4.7 5.0  CL 102   < > 104 103  --  108  CO2 21   < > 13* 24  --  24  GLUCOSE 98   < > 102* 68*  --  95  BUN 80*   < > 77* 70*  --  56*  CREATININE 3.13*   < > 2.81* 2.70*  --  2.26*  CALCIUM 9.3   < > 8.7* 8.7*  --  8.4*  PHOS  5.6*  --   --   --   --   --    < > = values in this interval not displayed.   Recent Labs  Lab 04/13/22 0937 04/14/22 0817 04/15/22 0548  WBC 6.3 5.5 3.7*  NEUTROABS 4.1  --   --   HGB 15.4 14.7 12.2*  HCT 44.7 42.4 35.6*  MCV 78.7 78.1* 78.8*  PLT 120.0* 113* 72*

## 2022-04-15 NOTE — Progress Notes (Signed)
Lower extremity venous duplex has been completed.   Preliminary results in CV Proc.   Aundra Millet Ronnika Collett 04/15/2022 9:46 AM

## 2022-04-15 NOTE — Assessment & Plan Note (Signed)
Right myofacial tear involving the medial head gastrocnemius with extensive intramuscular edema and a crescentic hematoma along the outer surface measuring 2,9 x 8,7 cm in the axial and 20,4 cm in craniocaudal extent.   Lower extremities doppler US with right age indeterminate superficial vein thrombosis involving the right small saphenous vein.  No DVT on the right or left lower extremities.   Continue outpatient PT and follow up with Orthopedics.

## 2022-04-15 NOTE — Assessment & Plan Note (Signed)
Patient will continue blood pressure control with amlodipine 10 mg and have close follow up as outpatient.  Likely he has long standing hypertension.

## 2022-04-15 NOTE — Hospital Course (Signed)
Joshua Wagner was admitted to the hospital with the working diagnosis of hyperkalemia.   51 yo male with the past medical history of ITP, who was referred to the ED by his primary care provider due to hyperkalemia and acute kidney injury. About 2 weeks ago patient was diagnosed with traumatic right gastrocnemius myofacial tear, treated with short leg splint and crutches. His blood pressure was noted to be elevated then and at a second visit to the ED, systolic 205 to 190 mmHg with positive headaches. He was placed on amlodipine and benazepril and instructed to follow up with primary care. Out patient follow up renal function showed serum cr 3.0 and K of 7,0 that prompted a referral back to the ED.  On his initial physical examination his blood pressure was 112/72, HR 75, RR 19 and 02 saturation 97%, lungs with no rales or wheezing, heart with S1 and S2 present and rhythmic, abdomen with no distention, right lower extremity with splinter in place with no significant edema.   Na 131, K 6,1, CL 104 bicarbonate 13 glucose 102 bun 77, cr 2,81  Mg 3.1  CK 123 Wbc 5.5 hgb 14.7 plt 113  Urine analysis SG 1,006, pH 5.0, negative protein.   Chest radiograph with no cardiomegaly, no infiltrates, bibasilar atelectasis.   EKG 79 bpm, normal axis, normal intervals, sinus rhythm with small q wave led II, III, AvF, J point elevation V2 and V3, no significant ST segment or T wave changes. Positive LVH.   Patient was placed on IV fluids, and furosemide.  Hyperkalemia treatment with insulin, dextrose and calcium gluconate IV.  Sodium zirconium oral. His renal function and K has improved Patient will continue blood pressure control with dihydropyridine calcium channel blocker and have renal function and blood pressure follow up as outpatient.

## 2022-04-15 NOTE — Progress Notes (Incomplete)
Discharge instructions given.  Patient verbalized understanding.  Waiting for rx from [facility].  Discharged home.

## 2022-04-15 NOTE — Assessment & Plan Note (Signed)
Pancytopenia.  At the time of his discharge his wbc is 3.7, hgb 12.2 and plt 72. Plan to follow up cell count as outpatient.

## 2022-04-15 NOTE — TOC Transition Note (Signed)
Transition of Care [facility]) - CM/SW Discharge Note   Patient Details  Name: Cordarrel Stiefel MRN: [id number] Date of Birth: [birth year]  [facility] of [facility]) CM/SW Contact:  Tom-Johnson, Hershal Coria, RN Phone Number: 04/15/2022, 12:28 PM   Clinical Narrative:     Patient is scheduled for discharge today. No TOC needs or recommendations noted. Denies any needs. Family to transport at discharge. No further needs noted.   Final next level of care: Home/Self Care Barriers to Discharge: Barriers Resolved   Patient Goals and CMS Choice Patient states their goals for this hospitalization and ongoing recovery are:: To return home CMS Medicare.gov Compare Post Acute Care list provided to:: Patient Choice offered to / list presented to : NA  Discharge Placement                Patient to be transferred to facility by: Family      Discharge Plan and Services                DME Arranged: N/A DME Agency: NA       HH Arranged: NA HH Agency: NA        Social Determinants of Health (SDOH) Interventions Transportation Interventions: Intervention Not Indicated, Inpatient TOC, Patient Resources (Friends/Family)   Readmission Risk Interventions     No data to display

## 2022-04-19 ENCOUNTER — Ambulatory Visit: Payer: Self-pay

## 2022-04-19 DIAGNOSIS — E875 Hyperkalemia: Secondary | ICD-10-CM | POA: Diagnosis not present

## 2022-04-22 DIAGNOSIS — N179 Acute kidney failure, unspecified: Secondary | ICD-10-CM | POA: Diagnosis not present

## 2022-04-22 DIAGNOSIS — I129 Hypertensive chronic kidney disease with stage 1 through stage 4 chronic kidney disease, or unspecified chronic kidney disease: Secondary | ICD-10-CM | POA: Diagnosis not present

## 2022-04-22 DIAGNOSIS — E875 Hyperkalemia: Secondary | ICD-10-CM | POA: Diagnosis not present

## 2022-04-22 DIAGNOSIS — M79661 Pain in right lower leg: Secondary | ICD-10-CM | POA: Diagnosis not present

## 2022-04-22 DIAGNOSIS — D693 Immune thrombocytopenic purpura: Secondary | ICD-10-CM | POA: Diagnosis not present

## 2022-04-23 ENCOUNTER — Inpatient Hospital Stay: Payer: BC Managed Care – PPO | Attending: Hematology and Oncology | Admitting: Hematology and Oncology

## 2022-04-23 ENCOUNTER — Inpatient Hospital Stay: Payer: BC Managed Care – PPO

## 2022-04-23 VITALS — BP 144/86 | HR 66 | Temp 97.9°F | Resp 16 | Ht 69.5 in | Wt 183.4 lb

## 2022-04-23 DIAGNOSIS — D693 Immune thrombocytopenic purpura: Secondary | ICD-10-CM | POA: Diagnosis not present

## 2022-04-23 DIAGNOSIS — D7389 Other diseases of spleen: Secondary | ICD-10-CM | POA: Insufficient documentation

## 2022-04-23 DIAGNOSIS — R7989 Other specified abnormal findings of blood chemistry: Secondary | ICD-10-CM | POA: Insufficient documentation

## 2022-04-23 DIAGNOSIS — D696 Thrombocytopenia, unspecified: Secondary | ICD-10-CM | POA: Diagnosis not present

## 2022-04-23 LAB — CBC WITH DIFFERENTIAL (CANCER CENTER ONLY)
Abs Immature Granulocytes: 0.01 10*3/uL (ref 0.00–0.07)
Basophils Absolute: 0 10*3/uL (ref 0.0–0.1)
Basophils Relative: 0 %
Eosinophils Absolute: 0.1 10*3/uL (ref 0.0–0.5)
Eosinophils Relative: 2 %
HCT: 36.4 % — ABNORMAL LOW (ref 39.0–52.0)
Hemoglobin: 12.9 g/dL — ABNORMAL LOW (ref 13.0–17.0)
Immature Granulocytes: 0 %
Lymphocytes Relative: 19 %
Lymphs Abs: 0.7 10*3/uL (ref 0.7–4.0)
MCH: 27.2 pg (ref 26.0–34.0)
MCHC: 35.4 g/dL (ref 30.0–36.0)
MCV: 76.8 fL — ABNORMAL LOW (ref 80.0–100.0)
Monocytes Absolute: 0.3 10*3/uL (ref 0.1–1.0)
Monocytes Relative: 9 %
Neutro Abs: 2.5 10*3/uL (ref 1.7–7.7)
Neutrophils Relative %: 70 %
Platelet Count: 81 10*3/uL — ABNORMAL LOW (ref 150–400)
RBC: 4.74 MIL/uL (ref 4.22–5.81)
RDW: 12.5 % (ref 11.5–15.5)
Smear Review: NORMAL
WBC Count: 3.5 10*3/uL — ABNORMAL LOW (ref 4.0–10.5)
nRBC: 0 % (ref 0.0–0.2)

## 2022-04-23 LAB — VITAMIN B12: Vitamin B-12: 646 pg/mL (ref 180–914)

## 2022-04-23 LAB — IMMATURE PLATELET FRACTION: Immature Platelet Fraction: 1.9 % (ref 1.2–8.6)

## 2022-04-23 LAB — FOLATE: Folate: 15.5 ng/mL (ref >5.9)

## 2022-04-23 NOTE — Assessment & Plan Note (Signed)
Lab review: 03/04/2016: Platelets 44 03/16/2016: Platelets 122 09/21/2016: Platelets 80 07/19/2019: Platelets 82 04/15/2022: WBC 3.7, hemoglobin 12.2, platelets 72 (hospitalization for hyperkalemia, recent gastrocnemius myofascial tear)  Patient has history of chronic ITP.  He was seen previously by Dr. Bertis Ruddy.  Also had multiple bone marrow biopsies and extensive evaluation including JAK2 mutation testing which were normal. His low platelet count is usually precipitated by sepsis or infection or medications.  He also had a splenic lesion for which PET/CT scans and further evaluations did not reveal any clear-cut etiology. I discussed with him that the platelet count more than 50,000 with a chronic ITP diagnosis does not require any treatment. This can be watched and monitored.

## 2022-04-23 NOTE — Progress Notes (Signed)
[facility] CONSULT NOTE  Patient Care Team: Lula Olszewski, MD as PCP - General (Internal Medicine)  CHIEF COMPLAINTS/PURPOSE OF CONSULTATION:  Thrombocytopenia  HISTORY OF PRESENTING ILLNESS:  Joshua Wagner 51 y.o. male is here because of diagnosis of thrombocytopenia going on for several years.  Recently he was hospitalized for hyperkalemia from blood pressure medication and he was referred to [place] for a follow-up of his low platelets.  His platelet count in 2018 was 80 and recently it was 72 so not a major difference in the platelet count.  He does not have any bruising or bleeding issues.  I reviewed her records extensively and collaborated the history with the patient.     MEDICAL HISTORY:  Past Medical History:  Diagnosis Date   Chest discomfort 04/13/2022   History of intermittent mild chest discomfort and poor cholesterol and uncontrolled hypertension   Hyperbilirubinemia    Multiple lung nodules 08/11/2016   Nephrolithiasis 08/11/2016   Sepsis (HCC)    Splenic lesion 05/03/2016   Splenomegaly 05/03/2016   Thrombocytopenia, idiopathic (HCC)     SURGICAL HISTORY: Past Surgical History:  Procedure Laterality Date   TEE WITHOUT CARDIOVERSION N/A 03/08/2016   Procedure: TRANSESOPHAGEAL ECHOCARDIOGRAM (TEE);  Surgeon: Quintella Reichert, MD;  Location: [facility] ENDOSCOPY;  Service: Cardiovascular;  Laterality: N/A;    SOCIAL HISTORY: Social History   Socioeconomic History   Marital status: Married    Spouse name: Not on file   Number of children: 2   Years of education: Not on file   Highest education level: Not on file  Occupational History   Occupation: Dentist  Tobacco Use   Smoking status: Never   Smokeless tobacco: Never  Vaping Use   Vaping Use: Never used  Substance and Sexual Activity   Alcohol use: No   Drug use: No   Sexual activity: Yes    Partners: Female  Other Topics Concern   Not on file  Social History Narrative   Not on file    Social Determinants of Health   Financial Resource Strain: Not on file  Food Insecurity: No Food Insecurity (04/14/2022)   Hunger Vital Sign    Worried About Running Out of Food in the Last Year: Never true    Ran Out of Food in the Last Year: Never true  Transportation Needs: No Transportation Needs (04/14/2022)   PRAPARE - Administrator, Civil Service (Medical): No    Lack of Transportation (Non-Medical): No  Physical Activity: Not on file  Stress: Not on file  Social Connections: Not on file  Intimate Partner Violence: Not At Risk (04/14/2022)   Humiliation, Afraid, Rape, and Kick questionnaire    Fear of Current or Ex-Partner: No    Emotionally Abused: No    Physically Abused: No    Sexually Abused: No    FAMILY HISTORY: Family History  Problem Relation Age of Onset   Diabetes Mother    Diabetes Father    Diabetes Maternal Grandfather    Heart disease Maternal Grandfather    Diabetes Brother     ALLERGIES:  is allergic to bactrim [sulfamethoxazole-trimethoprim], advil [ibuprofen], penicillins, sulfa antibiotics, and latex.  MEDICATIONS:  Current Outpatient Medications  Medication Sig Dispense Refill   acetaminophen (TYLENOL) 325 MG tablet Take 2 tablets (650 mg total) by mouth every 6 (six) hours as needed for moderate pain.     amLODipine (NORVASC) 10 MG tablet Take 1 tablet (10 mg total) by mouth daily. 30 tablet  0   hydrochlorothiazide (HYDRODIURIL) 12.5 MG tablet Take 12.5 mg by mouth daily.     Multiple Vitamin (MULTIVITAMIN) tablet Take 1 tablet by mouth daily.     No current facility-administered medications for this visit.    REVIEW OF SYSTEMS:   Constitutional: Denies fevers, chills or abnormal night sweats   All other systems were reviewed with the patient and are negative.  PHYSICAL EXAMINATION: ECOG PERFORMANCE STATUS: 0 - Asymptomatic  Vitals:   04/23/22 0913  BP: (!) 144/86  Pulse: 66  Resp: 16  Temp: 97.9 F (36.6 C)    Filed Weights   04/23/22 0913  Weight: 183 lb 6.4 oz (83.2 kg)    GENERAL:alert, no distress and comfortable    LABORATORY DATA:  I have reviewed the data as listed Lab Results  Component Value Date   WBC 3.7 (L) 04/15/2022   HGB 12.2 (L) 04/15/2022   HCT 35.6 (L) 04/15/2022   MCV 78.8 (L) 04/15/2022   PLT 72 (L) 04/15/2022   Lab Results  Component Value Date   NA 138 04/15/2022   K 5.0 04/15/2022   CL 108 04/15/2022   CO2 24 04/15/2022    RADIOGRAPHIC STUDIES: I have personally reviewed the radiological reports and agreed with the findings in the report.  ASSESSMENT AND PLAN:  Thrombocytopenia, idiopathic (HCC) Lab review: 03/04/2016: Platelets 44 03/16/2016: Platelets 122 09/21/2016: Platelets 80 07/19/2019: Platelets 82 04/15/2022: WBC 3.7, hemoglobin 12.2, platelets 72 (hospitalization for hyperkalemia, recent gastrocnemius myofascial tear)  Patient has history of chronic ITP.  He was seen previously by Dr. Bertis Ruddy.  Also had multiple bone marrow biopsies and extensive evaluation including JAK2 mutation testing which were normal. His low platelet count is usually precipitated by sepsis or infection or medications.  He also had a splenic lesion for which PET/CT scans and further evaluations did not reveal any clear-cut etiology. I discussed with him that the platelet count more than 50,000 with a chronic ITP diagnosis does not require any treatment. This can be watched and monitored.  I disc is about performing immature platelet fraction today.  This will guide [place] if there is decreased production or increased consumption.  Will also check B12 and folic acid levels along with CBC. 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  All questions were answered. The patient knows to call the clinic with any problems, questions or concerns.    Tamsen Meek, MD 04/23/22

## 2022-04-26 ENCOUNTER — Ambulatory Visit: Payer: BC Managed Care – PPO | Admitting: Internal Medicine

## 2022-04-30 DIAGNOSIS — Z1211 Encounter for screening for malignant neoplasm of colon: Secondary | ICD-10-CM | POA: Diagnosis not present

## 2022-05-03 ENCOUNTER — Other Ambulatory Visit: Payer: Self-pay | Admitting: *Deleted

## 2022-05-03 DIAGNOSIS — N183 Chronic kidney disease, stage 3 unspecified: Secondary | ICD-10-CM

## 2022-05-03 NOTE — Progress Notes (Signed)
HEMATOLOGY-ONCOLOGY TELEPHONE VISIT PROGRESS NOTE  I connected with our patient on 05/10/22 at  8:15 AM EST by telephone and verified that I am speaking with the correct person using two identifiers.  I discussed the limitations, risks, security and privacy concerns of performing an evaluation and management service by telephone and the availability of in person appointments.  I also discussed with the patient that there may be a patient responsible charge related to this service. The patient expressed understanding and agreed to proceed.   History of Present Illness: Joshua Wagner 52 y.o. male is here because of diagnosis of thrombocytopenia going on for several years.  Recently he was hospitalized for hyperkalemia from blood pressure medication and he was referred to [place] for a follow-up of his low platelets. He presents to the clinic via phone visit.   REVIEW OF SYSTEMS:   Constitutional: Denies fevers, chills or abnormal weight loss All other systems were reviewed with the patient and are negative. Observations/Objective:     Assessment Plan:  Thrombocytopenia, idiopathic ([place]) Lab review: 03/04/2016: Platelets 44 03/16/2016: Platelets 122 09/21/2016: Platelets 80 07/19/2019: Platelets 82 04/15/2022: WBC 3.7, hemoglobin 12.2, platelets 72 (hospitalization for hyperkalemia, recent gastrocnemius myofascial tear) 04/23/2022: WBC 3.5, hemoglobin 12.9, MCV 76.8, platelets 81, immature platelet fraction 1.9, folic acid 48.5, I62 703   Patient has history of chronic ITP.  He was seen previously by Dr. Alvy Bimler.  Also had multiple bone marrow biopsies and extensive evaluation including JAK2 mutation testing which were normal. His low platelet count is usually precipitated by sepsis or infection or medications.   He also had a splenic lesion for which PET/CT scans and further evaluations did not reveal any clear-cut etiology.  Discussion on the blood work: I discussed with the patient that the low  immature platelet fraction is suggesting that there is a component of decreased production as well.  Since the platelet count is greater than 50 there is no indication for treatment at this time.  This can be watched and monitored and we will recheck labs in 6 months and follow-up.   I discussed the assessment and treatment plan with the patient. The patient was provided an opportunity to ask questions and all were answered. The patient agreed with the plan and demonstrated an understanding of the instructions. The patient was advised to call back or seek an in-person evaluation if the symptoms worsen or if the condition fails to improve as anticipated.   I provided 12 minutes of non-face-to-face time during this encounter.  This includes time for charting and coordination of care   Harriette Ohara, MD  I Gardiner Coins am acting as a scribe for Dr.Sayvion Vigen  I have reviewed the above documentation for accuracy and completeness, and I agree with the above.

## 2022-05-04 ENCOUNTER — Ambulatory Visit: Payer: Self-pay | Admitting: Nurse Practitioner

## 2022-05-05 ENCOUNTER — Ambulatory Visit (HOSPITAL_COMMUNITY)
Admission: RE | Admit: 2022-05-05 | Discharge: 2022-05-05 | Disposition: A | Discharge status: Home / Self Care | Payer: BC Managed Care – PPO | Source: Ambulatory Visit | Attending: Vascular Surgery | Admitting: Vascular Surgery

## 2022-05-05 DIAGNOSIS — N1832 Chronic kidney disease, stage 3b: Secondary | ICD-10-CM | POA: Diagnosis not present

## 2022-05-05 DIAGNOSIS — N183 Chronic kidney disease, stage 3 unspecified: Secondary | ICD-10-CM | POA: Diagnosis not present

## 2022-05-06 ENCOUNTER — Ambulatory Visit: Payer: Self-pay | Admitting: Nurse Practitioner

## 2022-05-10 ENCOUNTER — Inpatient Hospital Stay: Payer: BC Managed Care – PPO | Attending: Hematology and Oncology | Admitting: Hematology and Oncology

## 2022-05-10 DIAGNOSIS — D693 Immune thrombocytopenic purpura: Secondary | ICD-10-CM | POA: Diagnosis not present

## 2022-05-10 LAB — COLOGUARD: COLOGUARD: NEGATIVE

## 2022-05-10 NOTE — Assessment & Plan Note (Signed)
Lab review: 03/04/2016: Platelets 44 03/16/2016: Platelets 122 09/21/2016: Platelets 80 07/19/2019: Platelets 82 04/15/2022: WBC 3.7, hemoglobin 12.2, platelets 72 (hospitalization for hyperkalemia, recent gastrocnemius myofascial tear) 04/23/2022: WBC 3.5, hemoglobin 12.9, MCV 76.8, platelets 81, immature platelet fraction 1.9   Patient has history of chronic ITP.  He was seen previously by Dr. Alvy Bimler.  Also had multiple bone marrow biopsies and extensive evaluation including JAK2 mutation testing which were normal. His low platelet count is usually precipitated by sepsis or infection or medications.   He also had a splenic lesion for which PET/CT scans and further evaluations did not reveal any clear-cut etiology.  Discussion on the blood work: I discussed with the patient that the low immature platelet fraction is suggesting that there is a component of decreased production as well.  Since the platelet count is greater than 50 there is no indication for treatment at this time.  This can be watched and monitored and we will recheck labs in 3 months and follow-up.

## 2022-05-11 NOTE — Progress Notes (Signed)
Normal test, repeat in 3 years

## 2022-05-12 ENCOUNTER — Encounter: Payer: Self-pay | Admitting: Vascular Surgery

## 2022-05-12 ENCOUNTER — Ambulatory Visit: Payer: BC Managed Care – PPO | Admitting: Vascular Surgery

## 2022-05-12 VITALS — BP 142/93 | HR 95 | Temp 98.2°F | Resp 20 | Ht 69.5 in | Wt 179.0 lb

## 2022-05-12 DIAGNOSIS — I10 Essential (primary) hypertension: Secondary | ICD-10-CM

## 2022-05-12 NOTE — Progress Notes (Signed)
ASSESSMENT & PLAN   HYPERTENSION: This patient had a renal artery duplex scan on 05/05/2022 which showed no evidence of renal artery stenosis.  The kidneys were normal in size.  Currently his blood pressure is under fairly good control although he is on amlodipine, hydrochlorothiazide, and lovastatin.  I would not recommend a CT angiogram or arteriogram given that the duplex is normal and given the risk associated with contrast administration.  We have discussed the importance of exercise, nutrition, and meditation as other modalities which may potentially help lower his blood pressure.  I will be happy to see him back at any time if any new vascular issues arise.  REASON FOR CONSULT:    Renal disease and hypertension.  The consult is requested by Dr. Royce Macadamia.  HPI:   Joshua Wagner is a 52 y.o. male who was referred with a renal disease and hypertension.  I have reviewed the records from the referring office.  The patient was seen on 04/22/2022.  He does have a history of ITP.  He has CKD 3.  Current blood pressure medications include amlodipine, hydrochlorothiazide, and lorstatin.   On my history, the patient denies any abdominal pain or flank pain.  He has not had no headaches or hematuria.  Past Medical History:  Diagnosis Date   Chest discomfort 04/13/2022   History of intermittent mild chest discomfort and poor cholesterol and uncontrolled hypertension   Hyperbilirubinemia    Multiple lung nodules 08/11/2016   Nephrolithiasis 08/11/2016   Sepsis ([place])    Splenic lesion 05/03/2016   Splenomegaly 05/03/2016   Thrombocytopenia, idiopathic (HCC)     Family History  Problem Relation Age of Onset   Diabetes Mother    Diabetes Father    Diabetes Maternal Grandfather    Heart disease Maternal Grandfather    Diabetes Brother     SOCIAL HISTORY: Social History   Tobacco Use   Smoking status: Never   Smokeless tobacco: Never  Substance Use Topics   Alcohol use: No     Allergies  Allergen Reactions   Bactrim [Sulfamethoxazole-Trimethoprim] Other (See Comments)    Jaundice & elevated bilirubin - see admission November 2017 for details   Advil [Ibuprofen]     Due to ITP.   Benazepril Other (See Comments)    Hyperkalemia   Penicillins    Sulfa Antibiotics    Latex Rash    Current Outpatient Medications  Medication Sig Dispense Refill   amLODipine (NORVASC) 10 MG tablet Take 1 tablet (10 mg total) by mouth daily. 30 tablet 0   hydrochlorothiazide (HYDRODIURIL) 12.5 MG tablet Take 12.5 mg by mouth daily.     losartan (COZAAR) 25 MG tablet Take 25 mg by mouth daily.     Multiple Vitamin (MULTIVITAMIN) tablet Take 1 tablet by mouth daily.     No current facility-administered medications for this visit.    REVIEW OF SYSTEMS:  [X]  denotes positive finding, [ ]  denotes negative finding Cardiac  Comments:  Chest pain or chest pressure:    Shortness of breath upon exertion:    Short of breath when lying flat:    Irregular heart rhythm:        Vascular    Pain in calf, thigh, or hip brought on by ambulation:    Pain in feet at night that wakes you up from your sleep:     Blood clot in your veins:    Leg swelling:         Pulmonary  Oxygen at home:    Productive cough:     Wheezing:         Neurologic    Sudden weakness in arms or legs:     Sudden numbness in arms or legs:     Sudden onset of difficulty speaking or slurred speech:    Temporary loss of vision in one eye:     Problems with dizziness:         Gastrointestinal    Blood in stool:     Vomited blood:         Genitourinary    Burning when urinating:     Blood in urine:        Psychiatric    Major depression:         Hematologic    Bleeding problems:    Problems with blood clotting too easily:        Skin    Rashes or ulcers:        Constitutional    Fever or chills:    -  PHYSICAL EXAM:   Vitals:   05/12/22 0829  BP: (!) 142/93  Pulse: 95  Resp: 20   Temp: 98.2 F (36.8 C)  SpO2: 98%  Weight: 179 lb (81.2 kg)  Height: 5' 9.5" (1.765 m)   Body mass index is 26.05 kg/m. GENERAL: The patient is a well-nourished male, in no acute distress. The vital signs are documented above. CARDIAC: There is a regular rate and rhythm.  VASCULAR: I do not detect carotid bruits. I do not detect any abdominal bruits. He has palpable femoral and pedal pulses bilaterally. PULMONARY: There is good air exchange bilaterally without wheezing or rales. ABDOMEN: Soft and non-tender with normal pitched bowel sounds.  MUSCULOSKELETAL: There are no major deformities. NEUROLOGIC: No focal weakness or paresthesias are detected. SKIN: There are no ulcers or rashes noted. PSYCHIATRIC: The patient has a normal affect.  DATA:    RENAL ARTERY DUPLEX: I have reviewed the renal artery duplex scan that was done on 05/05/2022.  The right kidney was normal in size.  There was no evidence of renal artery stenosis.  The left kidney was normal in size.  There was no evidence of renal artery stenosis.  LABS: Labs on 04/15/2022 showed a creatinine of 2.26.  GFR was 34.  Deitra Mayo [facility]

## 2022-05-17 ENCOUNTER — Other Ambulatory Visit (HOSPITAL_COMMUNITY): Payer: Self-pay | Admitting: Family Medicine

## 2022-05-17 DIAGNOSIS — M79604 Pain in right leg: Secondary | ICD-10-CM

## 2022-05-18 ENCOUNTER — Inpatient Hospital Stay (HOSPITAL_COMMUNITY): Admission: RE | Admit: 2022-05-18 | Payer: BC Managed Care – PPO | Source: Ambulatory Visit

## 2022-05-20 ENCOUNTER — Ambulatory Visit (HOSPITAL_COMMUNITY)
Admission: RE | Admit: 2022-05-20 | Discharge: 2022-05-20 | Disposition: A | Discharge status: Home / Self Care | Payer: BC Managed Care – PPO | Source: Ambulatory Visit | Attending: Family Medicine | Admitting: Family Medicine

## 2022-05-20 DIAGNOSIS — M79604 Pain in right leg: Secondary | ICD-10-CM | POA: Insufficient documentation

## 2022-05-31 DIAGNOSIS — N1832 Chronic kidney disease, stage 3b: Secondary | ICD-10-CM | POA: Diagnosis not present

## 2022-06-14 DIAGNOSIS — N179 Acute kidney failure, unspecified: Secondary | ICD-10-CM | POA: Diagnosis not present

## 2022-06-16 DIAGNOSIS — I129 Hypertensive chronic kidney disease with stage 1 through stage 4 chronic kidney disease, or unspecified chronic kidney disease: Secondary | ICD-10-CM | POA: Diagnosis not present

## 2022-06-16 DIAGNOSIS — D693 Immune thrombocytopenic purpura: Secondary | ICD-10-CM | POA: Diagnosis not present

## 2022-06-16 DIAGNOSIS — N179 Acute kidney failure, unspecified: Secondary | ICD-10-CM | POA: Diagnosis not present

## 2022-06-16 DIAGNOSIS — E875 Hyperkalemia: Secondary | ICD-10-CM | POA: Diagnosis not present

## 2022-10-26 DIAGNOSIS — N1832 Chronic kidney disease, stage 3b: Secondary | ICD-10-CM | POA: Diagnosis not present

## 2022-10-31 DIAGNOSIS — N1831 Chronic kidney disease, stage 3a: Secondary | ICD-10-CM | POA: Diagnosis not present

## 2022-10-31 DIAGNOSIS — R809 Proteinuria, unspecified: Secondary | ICD-10-CM | POA: Diagnosis not present

## 2022-10-31 DIAGNOSIS — I129 Hypertensive chronic kidney disease with stage 1 through stage 4 chronic kidney disease, or unspecified chronic kidney disease: Secondary | ICD-10-CM | POA: Diagnosis not present

## 2022-10-31 DIAGNOSIS — E875 Hyperkalemia: Secondary | ICD-10-CM | POA: Diagnosis not present

## 2023-05-16 DIAGNOSIS — N1831 Chronic kidney disease, stage 3a: Secondary | ICD-10-CM | POA: Diagnosis not present

## 2023-05-18 DIAGNOSIS — R809 Proteinuria, unspecified: Secondary | ICD-10-CM | POA: Diagnosis not present

## 2023-05-18 DIAGNOSIS — I129 Hypertensive chronic kidney disease with stage 1 through stage 4 chronic kidney disease, or unspecified chronic kidney disease: Secondary | ICD-10-CM | POA: Diagnosis not present

## 2023-05-18 DIAGNOSIS — N1831 Chronic kidney disease, stage 3a: Secondary | ICD-10-CM | POA: Diagnosis not present

## 2023-05-18 DIAGNOSIS — E875 Hyperkalemia: Secondary | ICD-10-CM | POA: Diagnosis not present

## 2023-11-20 DIAGNOSIS — N1831 Chronic kidney disease, stage 3a: Secondary | ICD-10-CM | POA: Diagnosis not present

## 2023-11-27 DIAGNOSIS — I129 Hypertensive chronic kidney disease with stage 1 through stage 4 chronic kidney disease, or unspecified chronic kidney disease: Secondary | ICD-10-CM | POA: Diagnosis not present

## 2023-11-27 DIAGNOSIS — E875 Hyperkalemia: Secondary | ICD-10-CM | POA: Diagnosis not present

## 2023-11-27 DIAGNOSIS — R809 Proteinuria, unspecified: Secondary | ICD-10-CM | POA: Diagnosis not present

## 2023-11-27 DIAGNOSIS — N1831 Chronic kidney disease, stage 3a: Secondary | ICD-10-CM | POA: Diagnosis not present

## 2024-04-08 ENCOUNTER — Ambulatory Visit (INDEPENDENT_AMBULATORY_CARE_PROVIDER_SITE_OTHER): Admitting: Internal Medicine

## 2024-04-08 VITALS — BP 120/80 | HR 69 | Temp 98.0°F | Ht 69.5 in | Wt 190.2 lb

## 2024-04-08 DIAGNOSIS — N1831 Chronic kidney disease, stage 3a: Secondary | ICD-10-CM

## 2024-04-08 DIAGNOSIS — Z1211 Encounter for screening for malignant neoplasm of colon: Secondary | ICD-10-CM | POA: Diagnosis not present

## 2024-04-08 DIAGNOSIS — E785 Hyperlipidemia, unspecified: Secondary | ICD-10-CM | POA: Diagnosis not present

## 2024-04-08 DIAGNOSIS — Z136 Encounter for screening for cardiovascular disorders: Secondary | ICD-10-CM

## 2024-04-08 DIAGNOSIS — Z0001 Encounter for general adult medical examination with abnormal findings: Secondary | ICD-10-CM

## 2024-04-08 DIAGNOSIS — D693 Immune thrombocytopenic purpura: Secondary | ICD-10-CM

## 2024-04-08 DIAGNOSIS — Z125 Encounter for screening for malignant neoplasm of prostate: Secondary | ICD-10-CM

## 2024-04-08 DIAGNOSIS — R161 Splenomegaly, not elsewhere classified: Secondary | ICD-10-CM

## 2024-04-08 DIAGNOSIS — Z131 Encounter for screening for diabetes mellitus: Secondary | ICD-10-CM | POA: Diagnosis not present

## 2024-04-08 DIAGNOSIS — Z13220 Encounter for screening for lipoid disorders: Secondary | ICD-10-CM

## 2024-04-08 DIAGNOSIS — I129 Hypertensive chronic kidney disease with stage 1 through stage 4 chronic kidney disease, or unspecified chronic kidney disease: Secondary | ICD-10-CM

## 2024-04-08 LAB — COMPREHENSIVE METABOLIC PANEL WITH GFR
ALT: 22 U/L (ref 0–53)
AST: 19 U/L (ref 0–37)
Albumin: 4.4 g/dL (ref 3.5–5.2)
Alkaline Phosphatase: 58 U/L (ref 39–117)
BUN: 22 mg/dL (ref 6–23)
CO2: 28 meq/L (ref 19–32)
Calcium: 9.1 mg/dL (ref 8.4–10.5)
Chloride: 103 meq/L (ref 96–112)
Creatinine, Ser: 1.64 mg/dL — ABNORMAL HIGH (ref 0.40–1.50)
GFR: 47.64 mL/min — ABNORMAL LOW (ref >60.00)
Glucose, Bld: 95 mg/dL (ref 70–99)
Potassium: 4 meq/L (ref 3.5–5.1)
Sodium: 140 meq/L (ref 135–145)
Total Bilirubin: 0.6 mg/dL (ref 0.2–1.2)
Total Protein: 6.8 g/dL (ref 6.0–8.3)

## 2024-04-08 LAB — PSA: PSA: 0.43 ng/mL (ref 0.10–4.00)

## 2024-04-08 LAB — CBC WITH DIFFERENTIAL/PLATELET
Absolute Lymphocytes: 872 {cells}/uL (ref 850–3900)
Absolute Monocytes: 340 {cells}/uL (ref 200–950)
Basophils Absolute: 20 {cells}/uL (ref 0–200)
Basophils Relative: 0.5 %
Eosinophils Absolute: 40 {cells}/uL (ref 15–500)
Eosinophils Relative: 1 %
HCT: 39.1 % — ABNORMAL LOW (ref 39.4–51.1)
Hemoglobin: 13.1 g/dL — ABNORMAL LOW (ref 13.2–17.1)
MCH: 27.1 pg (ref 27.0–33.0)
MCHC: 33.5 g/dL (ref 31.6–35.4)
MCV: 81 fL — ABNORMAL LOW (ref 81.4–101.7)
MPV: 9.5 fL (ref 7.5–12.5)
Monocytes Relative: 8.5 %
Neutro Abs: 2728 {cells}/uL (ref 1500–7800)
Neutrophils Relative %: 68.2 %
Platelets: 78 Thousand/uL — ABNORMAL LOW (ref 140–400)
RBC: 4.83 Million/uL (ref 4.20–5.80)
RDW: 14.8 % (ref 11.0–15.0)
Total Lymphocyte: 21.8 %
WBC: 4 Thousand/uL (ref 3.8–10.8)

## 2024-04-08 LAB — TSH: TSH: 1.73 u[IU]/mL (ref 0.35–5.50)

## 2024-04-08 MED ORDER — HYDROCHLOROTHIAZIDE 12.5 MG PO TABS: 12.5000 mg | ORAL_TABLET | Freq: Every day | ORAL | 4 refills | Status: AC

## 2024-04-08 MED ORDER — AMLODIPINE BESYLATE 5 MG PO TABS: 10.0000 mg | ORAL_TABLET | Freq: Every day | ORAL | 4 refills | Status: AC

## 2024-04-08 MED ORDER — LOSARTAN POTASSIUM 50 MG PO TABS: 50.0000 mg | ORAL_TABLET | Freq: Every day | ORAL | 4 refills | Status: AC

## 2024-04-08 NOTE — Assessment & Plan Note (Addendum)
 Comprehensive Preventive Examination (Annual Wellness Visit) Performed Today.  [facility] Summary:  Adult Wellness Visit   This routine adult wellness visit focused on preventive health measures and lifestyle modifications. A general health panel, PSA blood test, and advanced cholesterol panel were ordered. A CT cardiac calcium  score was recommended for early heart disease detection, along with a full abdominal ultrasound to follow up on splenomegaly. A hepatitis B titer and Cologuard every three years for colon cancer screening were advised. If the fib-4 test is abnormal, a liver ultrasound elastogram is recommended. Debrox was suggested for ear wax removal, and an Apple Watch for health monitoring.   ?? Core Review: Interval HPI/ROS reviewed. History, current Medications (including reconciliation), known Allergies, and Immunization Status were thoroughly reviewed and updated. ?? Risk Assessment: Comprehensive Family, Social (including tobacco/alcohol/substance use), and Safety/Functional risks were assessed. Vitals and a complete, age-appropriate Physical Exam were fully documented. ? Preventive Planning: I reviewed all appropriate preventive screening tests (e.g., mammogram, colonoscopy, cervical cancer screening) and immunization needs (e.g., influenza, pneumococcal, Zoster). ??? Counseling & Education: Provided individualized counseling on Healthy Diet (e.g., DASH/Mediterranean), Regular Physical Activity (consistent with CDC guidelines), Sleep Hygiene, Stress Management strategies, and Mental Health screening results. ?? Conclusion: Shared decision-making was performed for all recommended interventions. Orders were placed for indicated screenings and/or vaccinations. Written educational materials were provided to reinforce today's counseling and plan.

## 2024-04-08 NOTE — Assessment & Plan Note (Addendum)
 Shared decision-making done; patient understood rationale and agreed to labwork  Hypertension with stage 3a chronic kidney disease   Hypertension is managed with amlodipine  and losartan , alongside stage 3a chronic kidney disease. Continue amlodipine  5 mg daily, losartan  50 mg daily, and hydrochlorothiazide  12.5 mg daily.  Suspected fatty liver disease   Suspected due to slight visceral fat and splenomegaly, with no alcohol use reported. A fib-4 test was ordered to assess liver health, and a liver ultrasound elastogram will be considered if the fib-4 test is abnormal.  Hyperlipidemia   An advanced cholesterol panel was ordered for better assessment.  Splenomegaly   The etiology of splenomegaly is unclear, possibly linked to suspected fatty liver disease. No alcohol use is reported. A full abdominal ultrasound was ordered for follow-up, and a fib-4 test to assess liver health.

## 2024-04-08 NOTE — Progress Notes (Signed)
 [facility] at [facility] [address] [place], [place] [address] Office:  [phone]  -- Annual Preventive Medical Office Visit --  Patient:  Joshua Wagner      Age: 53 y.o.       Sex:  male  Date:   04/08/2024 Patient Care Team: Jesus Bernardino MATSU, MD as PCP - General (Internal Medicine) Odean Potts, MD as Consulting Physician (Hematology and Oncology) Jerrye Katheryn BROCKS, MD as Consulting Physician (Nephrology) Today's Healthcare Provider: Bernardino MATSU Jesus, MD  ========================================= Chief complaint: Annual Exam (Pt is present for cpe pt is fasting for labs.)  Purpose of Visit: Comprehensive preventive health assessment and personalized health maintenance planning.  This encounter was conducted as a Comprehensive Physical Exam (CPE) preventive care annual visit. The patient's medical history and problem list were reviewed to inform individualized preventive care recommendations.   No problem-specific medical treatment was provided during this visit.  Assessment & Plan Encounter for annual general medical examination with abnormal findings in adult Screening for cardiovascular condition Screening for diabetes mellitus Screening for malignant neoplasm of colon Screening for lipoid disorders Comprehensive Preventive Examination (Annual Wellness Visit) Performed Today.  Abridge Summary:  Adult Wellness Visit   This routine adult wellness visit focused on preventive health measures and lifestyle modifications. A general health panel, PSA blood test, and advanced cholesterol panel were ordered. A CT cardiac calcium  score was recommended for early heart disease detection, along with a full abdominal ultrasound to follow up on splenomegaly. A hepatitis B titer and Cologuard every three years for colon cancer screening were advised. If the fib-4 test is abnormal, a liver ultrasound elastogram is recommended. Debrox was suggested for ear wax removal, and an  Apple Watch for health monitoring.   ?? Core Review: Interval HPI/ROS reviewed. History, current Medications (including reconciliation), known Allergies, and Immunization Status were thoroughly reviewed and updated. ?? Risk Assessment: Comprehensive Family, Social (including tobacco/alcohol/substance use), and Safety/Functional risks were assessed. Vitals and a complete, age-appropriate Physical Exam were fully documented. ? Preventive Planning: I reviewed all appropriate preventive screening tests (e.g., mammogram, colonoscopy, cervical cancer screening) and immunization needs (e.g., influenza, pneumococcal, Zoster). ??? Counseling & Education: Provided individualized counseling on Healthy Diet (e.g., DASH/Mediterranean), Regular Physical Activity (consistent with CDC guidelines), Sleep Hygiene, Stress Management strategies, and Mental Health screening results. ?? Conclusion: Shared decision-making was performed for all recommended interventions. Orders were placed for indicated screenings and/or vaccinations. Written educational materials were provided to reinforce today's counseling and plan.  Hyperlipidemia, acquired Hypertensive nephropathy Stage 3a chronic kidney disease (HCC) Thrombocytopenia, idiopathic (HCC) Splenomegaly Shared decision-making done; patient understood rationale and agreed to labwork  Hypertension with stage 3a chronic kidney disease   Hypertension is managed with amlodipine  and losartan , alongside stage 3a chronic kidney disease. Continue amlodipine  5 mg daily, losartan  50 mg daily, and hydrochlorothiazide  12.5 mg daily.  Suspected fatty liver disease   Suspected due to slight visceral fat and splenomegaly, with no alcohol use reported. A fib-4 test was ordered to assess liver health, and a liver ultrasound elastogram will be considered if the fib-4 test is abnormal.  Hyperlipidemia   An advanced cholesterol panel was ordered for better assessment.  Splenomegaly    The etiology of splenomegaly is unclear, possibly linked to suspected fatty liver disease. No alcohol use is reported. A full abdominal ultrasound was ordered for follow-up, and a fib-4 test to assess liver health. Screening for malignant neoplasm of prostate The natural history of prostate cancer and  ongoing controversy regarding screening and potential treatment outcomes of prostate cancer has been discussed with the patient. , The meaning of a false positive PSA and a false negative PSA has been discussed. , LUTS, Benefits/Risks/Limitations/Costs were reviewed, and He indicates understanding of the limitations of this screening test and consents to proceed with screening.     ICD-10-CM   1. Encounter for annual general medical examination with abnormal findings in adult  Z00.01 FIB-4 W/REFLEX TO ELF    US  Abdomen Complete    Hepatitis B surface antibody,quantitative    CBC    Differential    Comprehensive metabolic panel    TSH    Lipoprotein Fractionation, NMR with Lipid Panel (Triglycerides/HDL-C)    PSA    CT CARDIAC SCORING (SELF PAY ONLY)    2. Screening for cardiovascular condition  Z13.6     3. Screening for diabetes mellitus  Z13.1     4. Screening for malignant neoplasm of colon  Z12.11     5. Screening for lipoid disorders  Z13.220 Lipoprotein Fractionation, NMR with Lipid Panel (Triglycerides/HDL-C)    6. Hyperlipidemia, acquired  E78.5 Lipoprotein Fractionation, NMR with Lipid Panel (Triglycerides/HDL-C)    CT CARDIAC SCORING (SELF PAY ONLY)    7. Hypertensive nephropathy  I12.9 amLODipine  (NORVASC ) 5 MG tablet    losartan  (COZAAR ) 50 MG tablet    hydrochlorothiazide  (HYDRODIURIL ) 12.5 MG tablet    CT CARDIAC SCORING (SELF PAY ONLY)    8. Stage 3a chronic kidney disease (HCC)  N18.31     9. Thrombocytopenia, idiopathic (HCC)  D69.3 CBC    10. Splenomegaly  R16.1     11. Screening for malignant neoplasm of prostate  Z12.5 PSA        Reviewed/updated/encouraged completion: Immunization History  Administered Date(s) Administered   Influenza Inj Mdck Quad Pf 03/10/2018, 02/01/2020   Influenza,inj,Quad PF,6+ Mos 03/08/2016, 01/22/2017, 01/05/2019   Influenza,inj,Quad PF,6-35 Mos 01/22/2017, 01/05/2019   Influenza-Unspecified 01/28/2017, 03/13/2021   MenQuadfi_Meningococcal Groups ACYW Conjugate 03/24/2021   Meningococcal Conjugate 01/22/2017   Meningococcal Mcv4o 12/26/2015, 08/24/2016   Moderna SARS-COV2 Booster Vaccination 03/07/2020   Moderna Sars-Covid-2 Vaccination 06/17/2019, 07/15/2019   Pneumococcal Conjugate-13 08/24/2016   Health Maintenance Due  Topic Date Due   DTaP/Tdap/Td (1 - Tdap) Never done   Hepatitis B Vaccines 19-59 Average Risk (1 of 3 - 19+ 3-dose series) Never done   Colonoscopy  Never done   Pneumococcal Vaccine: 50+ Years (2 of 2 - PPSV23, PCV20, or PCV21) 10/19/2016   Zoster Vaccines- Shingrix (1 of 2) Never done   Influenza Vaccine  12/01/2023  [facility] at [facility], has had Hepatitis B Virus (HBV) ; he will do titier to see if needs since he had before.   Health Maintenance  Topic Date Due   DTaP/Tdap/Td (1 - Tdap) Never done   Hepatitis B Vaccines 19-59 Average Risk (1 of 3 - 19+ 3-dose series) Never done   Colonoscopy  Never done   Pneumococcal Vaccine: 50+ Years (2 of 2 - PPSV23, PCV20, or PCV21) 10/19/2016   Zoster Vaccines- Shingrix (1 of 2) Never done   Influenza Vaccine  12/01/2023   COVID-19 Vaccine (4 - 2025-26 season) 04/24/2024 (Originally 01/01/2024)   Hepatitis C Screening  Completed   HIV Screening  Completed   HPV VACCINES  Aged Out   Meningococcal B Vaccine  Aged Out    Reviewed the following verbally with patient and provided AVS materials:   HEALTH MAINTENANCE COUNSELING AND ANTICIPATORY GUIDANCE  Preventive Measure Recommendation  Eye Exams Every 1-2 years  Dental Care Cleanings every 6 months or more, brush/floss 3x daily  Sinus Care Saline  spray rinses daily  Sleep 8 hours nightly, good sleep hygiene, e-monitoring if any daytime drowsiness  Diet Fruits/vegetables/fiber/healthy fats, balance and moderation  Exercise 150 minutes weekly  Risk Behaviors Discouraged any/all high risk behaviors    CANCER SCREENING SHARED DECISION MAKING    Penile/Testicle/Scrotum Encouraged self-monitoring and reporting of genital abnormalities. Patient reports none.  Thyroid  Thyroid  was palpated for nodules today.  Prostate Individualized risks/benefits/costs discussed  No results found for: PSA  Colon HM Colonoscopy         Occasional bright red blood per rectum that improves with prep h and negative ColoGuard.    Lung Current guidelines recommend individuals aged 2 to 70 who currently smoke or formerly smoked and have a >= 20 pack-year smoking history should undergo annual screening with low-dose computed tomography (LDCT). Tobacco Use: Low Risk  (04/08/2024)   Patient History    Smoking Tobacco Use: Never    Smokeless Tobacco Use: Never    Passive Exposure: Not on file   Social History   Tobacco Use  Smoking Status Never  Smokeless Tobacco Never    Skin Advised regular sunscreen use. Patient denies worrisome, changing, or new skin lesions. Offered to include images in chart for surveillance. Showed patient these pictures of melanomas for reference to educate for self-monitoring.  Other Cancers Discussed lack of screening guidelines and insurance coverage for other cancer types.    Discussed the use of AI scribe software for clinical note transcription with the patient, who gave verbal consent to proceed.  History of Present Illness 53 year old male with hypertension and kidney disease who presents for a routine follow-up.  He is currently on amlodipine  5 mg, losartan  50 mg, hydrochlorothiazide  12.5 mg, and a multivitamin. His amlodipine  dose was adjusted earlier this year. He receives an annual flu vaccine and has had the flu  shot for the current year. 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  He has a history of splenomegaly for the last two to three years with no known cause. He denies alcohol use. He mentions occasional hematochezia and has had a negative Cologuard test within the last couple of years.  He reports a family history of heart disease in his grandfather, who lived into his eighties. He denies tobacco use and reports no significant skin issues, although he mentions having xerosis. He describes his diet as improving, with efforts to eat healthier meals, though he occasionally consumes fast food. He reports good sleep quality and denies daytime somnolence. 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 He tries to engage in some physical activity but recognizes the need for more cardiovascular exercise.    ROS A comprehensive ROS was negative for any concerning symptoms.   Completed medication reconciliation: Current Outpatient Medications on File Prior to Visit  Medication Sig   Multiple Vitamin (MULTIVITAMIN) tablet Take 1 tablet by mouth daily.   No current facility-administered medications on file prior to visit.   Medications Discontinued During This Encounter  Medication Reason   losartan  (COZAAR ) 25 MG tablet    amLODipine  (NORVASC ) 10 MG tablet    hydrochlorothiazide  (HYDRODIURIL ) 12.5 MG tablet Reorder  The following were reviewed and/or  entered/updated into our electronic MEDICAL RECORD NUMBERPast Medical History:  Diagnosis Date   Chest discomfort 04/13/2022   History of intermittent mild chest discomfort and poor cholesterol and uncontrolled hypertension   Hyperbilirubinemia    Multiple lung nodules 08/11/2016   Nephrolithiasis 08/11/2016   Sepsis (HCC)    Splenic lesion 05/03/2016   Splenomegaly 05/03/2016    Thrombocytopenia, idiopathic (HCC)    Past Surgical History:  Procedure Laterality Date   TEE WITHOUT CARDIOVERSION N/A 03/08/2016   Procedure: TRANSESOPHAGEAL ECHOCARDIOGRAM (TEE);  Surgeon: Wilbert JONELLE Bihari, MD;  Location[facility] ENDOSCOPY;  Service: Cardiovascular;  Laterality: N/A;   Social History   Socioeconomic History   Marital status: Married    Spouse name: Not on file   Number of children: 2   Years of education: Not on file   Highest education level: Professional school degree (e.g., MD, DDS, DVM, JD)  Occupational History   Occupation: Dentist  Tobacco Use   Smoking status: Never   Smokeless tobacco: Never  Vaping Use   Vaping status: Never Used  Substance and Sexual Activity   Alcohol use: No   Drug use: No   Sexual activity: Yes    Partners: Female  Other Topics Concern   Not on file  Social History Narrative   Not on file   Social Drivers of Health   Financial Resource Strain: Patient Declined (04/08/2024)   Overall Financial Resource Strain (CARDIA)    Difficulty of Paying Living Expenses: Patient declined  Food Insecurity: Patient Declined (04/08/2024)   Hunger Vital Sign    Worried About Running Out of Food in the Last Year: Patient declined    Ran Out of Food in the Last Year: Patient declined  Transportation Needs: No Transportation Needs (04/08/2024)   PRAPARE - Administrator, Civil Service (Medical): No    Lack of Transportation (Non-Medical): No  Physical Activity: Insufficiently Active (04/08/2024)   Exercise Vital Sign    Days of Exercise per Week: 3 days    Minutes of Exercise per Session: 30 min  Stress: No Stress Concern Present (04/08/2024)   [employer] of Occupational Health - Occupational Stress Questionnaire    Feeling of Stress: Only a little  Social Connections: Moderately Isolated (04/08/2024)   Social Connection and Isolation Panel    Frequency of Communication with Friends and Family: More than three times a week     Frequency of Social Gatherings with Friends and Family: Twice a week    Attends Religious Services: Patient declined    Database Administrator or Organizations: No    Attends Engineer, Structural: Not on file    Marital Status: Married  Catering Manager Violence: Not At Risk (04/14/2022)   Humiliation, Afraid, Rape, and Kick questionnaire    Fear of Current or Ex-Partner: No    Emotionally Abused: No    Physically Abused: No    Sexually Abused: No      04/08/2024    7:46 AM  Alcohol Use Disorder Test (AUDIT)  1. How often do you have a drink containing alcohol? 0   Family History  Problem Relation Age of Onset   Diabetes Mother    Diabetes Father    Diabetes Maternal Grandfather    Heart disease Maternal Grandfather    Diabetes Brother    Allergies  Allergen Reactions   Bactrim [Sulfamethoxazole-Trimethoprim] Other (See Comments)    Jaundice & elevated bilirubin - see admission November 2017 for details   Advil  [Ibuprofen ]  Due to ITP.   Benazepril  Other (See Comments)    Hyperkalemia   Penicillins    Sulfa Antibiotics    Latex Rash   Social History   Substance and Sexual Activity  Sexual Activity Yes   Partners: Female  @    04/08/2024    8:03 AM  Depression screen PHQ 2/9  Decreased Interest 0  Down, Depressed, Hopeless 0  PHQ - 2 Score 0      04/08/2024    8:04 AM  Fall Risk   Falls in the past year? 0  Number falls in past yr: 0  Injury with Fall? 0  Risk for fall due to : No Fall Risks  Follow up Falls evaluation completed     BP 120/80   Pulse 69   Temp 98 F (36.7 C) (Temporal)   Ht 5' 9.5 (1.765 m)   Wt 190 lb 3.2 oz (86.3 kg)   SpO2 98%   BMI 27.68 kg/m  BP Readings from Last 3 Encounters:  04/08/24 120/80  05/12/22 (!) 142/93  04/23/22 (!) 144/86   Wt Readings from Last 10 Encounters:  04/08/24 190 lb 3.2 oz (86.3 kg)  05/12/22 179 lb (81.2 kg)  04/23/22 183 lb 6.4 oz (83.2 kg)  04/14/22 191 lb 2.2 oz (86.7 kg)   04/13/22 189 lb (85.7 kg)  04/05/22 192 lb (87.1 kg)  06/19/19 195 lb 6.4 oz (88.6 kg)  06/12/19 188 lb (85.3 kg)  10/12/16 172 lb (78 kg)  09/21/16 175 lb 8 oz (79.6 kg)  Physical Exam  GEN: No acute distress, resting comfortably. HEENT: Cerumen impaction in right ear canal, obscuring view of tympanic membrane. Nasal passages normal. Vision normal. NECK: Neck supple, no masses. oropharynx clear, no thyromegaly noted, no palpable lymphadenopathy or thyroid  nodules. CARDIOVASCULAR: S1 and S2 heart sounds with regular rate and rhythm, no murmurs appreciated.Hearing by tuning fork normal despite cerumen PULMONARY: Normal work of breathing, clear to auscultation bilaterally, no crackles, wheezes, or rhonchi. ABDOMEN: Soft, nontender, nondistended. MSK: No edema, cyanosis, or clubbing noted. SKIN: Warm, dry, no lesions of concern observed. NEUROLOGICAL: Cranial nerves II-XII grossly intact, strength 5/5 in upper and lower extremities, reflexes symmetric and intact bilaterally. PSYCH: Normal affect and thought content, pleasant and cooperative.      ======================================  IMPORTANT HEALTH REMINDERS: Report any new or changing skin lesions promptly Maintain recommended screening schedules Discuss any new family history of cancer at future visits Follow up on any new symptoms that persist more than two weeks      Notes:  This document was synthesized by artificial intelligence (Abridge) using HIPAA-compliant recording of the clinical interaction;   We discussed the use of AI scribe software for clinical note transcription with the patient, who gave verbal consent to proceed.    This encounter employed state-of-the-art, real-time, collaborative documentation. The patient was empowered to actively review and assist in updating their electronic medical record on a shared monitor, ensuring transparency and improving accuracy.    Prior to and at the beginning of  Comprehensive Physical Exam (CPE) preventive care annual visit appointment types  we clarify to patients Our goal today is to focus on your preventive or annual Comprehensive Physical Exam (CPE) preventive care annual visit, which typically covers routine screenings and overall health maintenance. However, if you share any new or concerning symptoms--such as dizziness, passing out, severe pain, or anything else that may point to a more serious issue--we are both legally and ethically required to evaluate it.  We cannot simply overlook or ignore such concerns, even if you later decide you don't want to discuss them, because it could jeopardize your health.  If addressing a new concern takes us  beyond the scope of the preventive visit, we may need to bill separately for that portion of care. We understand financial considerations are important, and we're happy to discuss your options if something new comes up. However, we want to be clear that once you mention a potentially serious issue, we must investigate it; we can't ethically or legally exclude that from our records or our evaluation. Please let us  know all of your questions or worries. Together, we can decide how best to manage them and how to minimize any unexpected costs, but we want to keep you safe above all else.   This disclosure is mandated by professional ethics and legal obligations, as healthcare providers must address any substantial health concerns raised during any patient interaction and a comprehensive ROS is required by insurance companies for billing preventive-care visit type.   This disclosure ultimately discourages patients financially from reporting significant health issues.   Medical Screening Exam A medical screening exam (MSE) helps to determine whether you need immediate medical treatment relating to any number of symptoms you are having. This type of exam may be done in an emergency department, an urgent care setting, or your  health care provider's office. Depending on your symptoms and severity, you may need additional tests or medical therapy. It is important to note that an MSE does not necessarily mean that you will need or receive further medical testing or interventions if your symptoms are not deemed to be medically urgent (emergent). Tell a health care provider about: Any allergies you have. All medicines you are taking, including vitamins, herbs, eye drops, creams, and over-the-counter medicines. Any problems you or family members have had with anesthetic medicines. Any bleeding problems you have. Any surgeries you have had. Any medical conditions you have. Whether you are pregnant or may be pregnant. What happens during the test? During the exam, a health care provider does a short, often focused, physical exam and asks about your medical history to assess: Your current symptoms. Your overall health. Your need for possible further medical intervention. What can I expect after the test? If you have a regular health care provider, make an appointment for a follow-up visit with him or her. If you do not have a regular health care provider, ask about resources in your community. Your medical screening exam may determine that: You do not need emergency treatment at this time. You need treatment right away. You need to be transferred to another medical center. This may happen if you need an emergent specialist or consultant that is not available at the medical center you are at. You need to have more tests. A medical specialist may be consulted if needed. Get help right away if: Your condition gets worse. You develop new or troubling symptoms before you see your health care provider. These symptoms may represent a serious problem that is an emergency. Do not wait to see if the symptoms will go away. Get medical help right away. Call your local emergency services (911 in the [place] Do not drive yourself to  the hospital. Summary A medical screening exam helps to determine whether you need medical treatment right away. This type of exam may be done in an emergency department, an urgent care setting, or your health care provider's office. During the exam, a health  care provider does a short physical exam and asks about your current symptoms and overall health. Depending on the exam, more tests or therapies may be ordered. However, an MSE does not necessarily mean that you will have further medical testing if your symptoms are not deemed to be urgent. If you need further care that is not offered at your current medical center, you may need to be transferred to another facility. This information is not intended to replace advice given to you by your health care provider. Make sure you discuss any questions you have with your health care provider. Document Revised: 12/30/2020 Document Reviewed: 08/27/2020 Elsevier Patient Education  2024 Elsevier Inc.   Health Maintenance, Male Adopting a healthy lifestyle and getting preventive care are important in promoting health and wellness. Ask your health care provider about: The right schedule for you to have regular tests and exams. Things you can do on your own to prevent diseases and keep yourself healthy. What should I know about diet, weight, and exercise? Eat a healthy diet  Eat a diet that includes plenty of vegetables, fruits, low-fat dairy products, and lean protein. Do not eat a lot of foods that are high in solid fats, added sugars, or sodium. Maintain a healthy weight Body mass index (BMI) is a measurement that can be used to identify possible weight problems. It estimates body fat based on height and weight. Your health care provider can help determine your BMI and help you achieve or maintain a healthy weight. Get regular exercise Get regular exercise. This is one of the most important things you can do for your health. Most adults  should: Exercise for at least 150 minutes each week. The exercise should increase your heart rate and make you sweat (moderate-intensity exercise). Do strengthening exercises at least twice a week. This is in addition to the moderate-intensity exercise. Spend less time sitting. Even light physical activity can be beneficial. Watch cholesterol and blood lipids Have your blood tested for lipids and cholesterol at 53 years of age, then have this test every 5 years. You may need to have your cholesterol levels checked more often if: Your lipid or cholesterol levels are high. You are older than 53 years of age. You are at high risk for heart disease. What should I know about cancer screening? Many types of cancers can be detected early and may often be prevented. Depending on your health history and family history, you may need to have cancer screening at various ages. This may include screening for: Colorectal cancer. Prostate cancer. Skin cancer. Lung cancer. What should I know about heart disease, diabetes, and high blood pressure? Blood pressure and heart disease High blood pressure causes heart disease and increases the risk of stroke. This is more likely to develop in people who have high blood pressure readings or are overweight. Talk with your health care provider about your target blood pressure readings. Have your blood pressure checked: Every 3-5 years if you are 24-62 years of age. Every year if you are 106 years old or older. If you are between the ages of 71 and 13 and are a current or former smoker, ask your health care provider if you should have a one-time screening for abdominal aortic aneurysm (AAA). Diabetes Have regular diabetes screenings. This checks your fasting blood sugar level. Have the screening done: Once every three years after age 75 if you are at a normal weight and have a low risk for diabetes. More often and at  a younger age if you are overweight or have a high  risk for diabetes. What should I know about preventing infection? Hepatitis B If you have a higher risk for hepatitis B, you should be screened for this virus. Talk with your health care provider to find out if you are at risk for hepatitis B infection. Hepatitis C Blood testing is recommended for: Everyone born from [birth year] through 1965. Anyone with known risk factors for hepatitis C. Sexually transmitted infections (STIs) You should be screened each year for STIs, including gonorrhea and chlamydia, if: You are sexually active and are younger than 53 years of age. You are older than 53 years of age and your health care provider tells you that you are at risk for this type of infection. Your sexual activity has changed since you were last screened, and you are at increased risk for chlamydia or gonorrhea. Ask your health care provider if you are at risk. Ask your health care provider about whether you are at high risk for HIV. Your health care provider may recommend a prescription medicine to help prevent HIV infection. If you choose to take medicine to prevent HIV, you should first get tested for HIV. You should then be tested every 3 months for as long as you are taking the medicine. Follow these instructions at home: Alcohol use Do not drink alcohol if your health care provider tells you not to drink. If you drink alcohol: Limit how much you have to 0-2 drinks a day. Know how much alcohol is in your drink. In the [place] one drink equals one 12 oz bottle of beer (355 mL), one 5 oz glass of wine (148 mL), or one 1 oz glass of hard liquor (44 mL). Lifestyle Do not use any products that contain nicotine or tobacco. These products include cigarettes, chewing tobacco, and vaping devices, such as e-cigarettes. If you need help quitting, ask your health care provider. Do not use street drugs. Do not share needles. Ask your health care provider for help if you need support or information about  quitting drugs. General instructions Schedule regular health, dental, and eye exams. Stay current with your vaccines. Tell your health care provider if: You often feel depressed. You have ever been abused or do not feel safe at home. Summary Adopting a healthy lifestyle and getting preventive care are important in promoting health and wellness. Follow your health care provider's instructions about healthy diet, exercising, and getting tested or screened for diseases. Follow your health care provider's instructions on monitoring your cholesterol and blood pressure. This information is not intended to replace advice given to you by your health care provider. Make sure you discuss any questions you have with your health care provider. Document Revised: 09/07/2020 Document Reviewed: 09/07/2020 [employer]vier Patient Education  2024 [employer].

## 2024-04-08 NOTE — Patient Instructions (Addendum)
 VISIT SUMMARY: Joshua Wagner, a 53 year old male with hypertension and kidney disease, came in for a routine follow-up. He is currently on medications for hypertension and has a history of splenomegaly. He reports occasional blood in his stool and has a family history of heart disease. He is making efforts to improve his diet and physical activity levels.  YOUR PLAN: -ADULT WELLNESS VISIT: This visit focused on preventive health measures and lifestyle changes. We ordered a general health panel, PSA blood test, and advanced cholesterol panel. A CT cardiac calcium  score was recommended for early heart disease detection, along with a full abdominal ultrasound to follow up on your splenomegaly. We also advised a hepatitis B titer and Cologuard test every three years for colon cancer screening. If your fib-4 test is abnormal, we will recommend a liver ultrasound elastogram. Debrox was suggested for ear wax removal, and an Apple Watch for health monitoring.  -HYPERTENSION WITH STAGE 3A CHRONIC KIDNEY DISEASE: Hypertension is high blood pressure, and stage 3a chronic kidney disease means your kidneys are moderately damaged. Continue taking amlodipine  5 mg daily, losartan  50 mg daily, and hydrochlorothiazide  12.5 mg daily.  -HYPERLIPIDEMIA: Hyperlipidemia means you have high levels of fats in your blood. We ordered an advanced cholesterol panel for better assessment.  -SPLENOMEGALY: Splenomegaly means your spleen is enlarged. The cause is unclear but may be linked to suspected fatty liver disease. We ordered a full abdominal ultrasound for follow-up and a fib-4 test to assess liver health.  -SUSPECTED FATTY LIVER DISEASE: Fatty liver disease means there is fat buildup in your liver. This is suspected due to slight visceral fat and splenomegaly. We ordered a fib-4 test to assess liver health, and if it is abnormal, we will consider a liver ultrasound elastogram.  INSTRUCTIONS: Please follow up with the  recommended tests: general health panel, PSA blood test, advanced cholesterol panel, CT cardiac calcium  score, full abdominal ultrasound, hepatitis B titer, and Cologuard test every three years. Continue taking your current medications as prescribed. If your fib-4 test is abnormal, we will recommend a liver ultrasound elastogram. Use Debrox for ear wax removal and consider using an Apple Watch for health monitoring.  Building Your Long-Term Health Plan  During today's preventive visit, we covered a variety of important health checks to help you stay on top of your well-being.  We also discussed strategies to maintain your health and identified some areas that might benefit from further exploration.   Preventive care visits like today's are designed to be proactive, but sometimes additional attention may be needed.  Rest assured, we're here for you.  If these areas require further evaluation or management, we'd be happy to schedule a separate, focused appointment to address them in detail.  Addressing Next Steps  [x]   Follow-up Visit: To ensure we address any unresolved issues and continue monitoring your overall health, we recommend scheduling a follow-up appointment in 1 year for your next preventive care visit. If you experience any new problems, need to discuss any medical concerns, or your condition worsens before then, please don't hesitate to call our office to schedule an appointment or seek emergency care as needed.  [x]   Preventive Measures: Maintaining healthy habits plays a crucial role in overall wellness. We recommend considering these tips: [x]   Regular appointments with dental and vision professionals [x]   Nightly nasal saline mist to keep sinuses clear [x]   Consistent toothbrushing to maintain oral health [x]   Using an app like SnoreLab to track sleep quality [x]   Routine  checks of blood pressure and heart rate [x]   Medical Information: In some instances, we may require additional  medical information from other providers to create a comprehensive picture of your health. If applicable, we can provide a medical information release form at the front desk for you to sign, allowing us  to gather these records. [x]   Lab Tests: If any lab tests were ordered today, scheduling them within a week of your visit helps ensure the best possible insurance coverage.  Planning Follow Up to Work on a Problem? Make the Most of Our Focused (20 minute) Appointments  [x]   Clearly state your top concerns at the beginning of the visit to focus our discussion [x]   If you anticipate you will need more time, please inform the front desk during scheduling - we can book multiple appointments in the same week. [x]   If you have transportation problems- use our convenient video appointments or ask about transportation support. [x]   We can get down to business faster if you use MyChart to update information before the visit and submit non-urgent questions before your visit. Thank you for taking the time to provide details through MyChart.  Let our nurse know and she can import this information into your encounter documents.  Arrival and Wait Times  [x]   Arriving on time ensures that everyone receives prompt attention. [x]   Early morning (8a) and afternoon (1p) appointments tend to have shortest wait times. [x]   Unfortunately, we cannot delay appointments for late arrivals or hold slots during phone calls.  Bring to Your Next Appointment:  [x]   Medications: Please bring all your medication bottles to your next appointment to ensure we have an accurate record of your prescriptions. [x]   Health Diaries: If you're monitoring any health conditions at home, keeping a diary of your readings can be very helpful for discussions at your next appointment.  Reviewing Your Records  [x]   Review your attached preventive care information at the end of these patient instructions. [x]   Review this early draft of your  clinical encounter notes below and the final encounter summary tomorrow on MyChart after its been completed.      Getting Answers and Following Up  [x]   Simple Questions & Concerns: For quick questions or basic follow-up after your visit, reach us  at [phone] or MyChart messaging. [x]   Complex Concerns: If your concern is more complex, scheduling an appointment might be best. Discuss this with the staff to find the most suitable option. [x]   Lab & Imaging Results: We'll contact you directly if results are abnormal or you don't use MyChart. Most normal results will be on MyChart within 2-3 business days, with a review message from Dr. Jesus. Haven't heard back in 2 weeks? Need results sooner? Contact us  at [phone]. [x]   Referrals: Our referral coordinator will manage specialist referrals. The specialist's office should contact you within 2 weeks to schedule an appointment. Call us  if you haven't heard from them after 2 weeks.  Staying Connected  [x]   MyChart: Activate your MyChart for the fastest way to access results and message us . See the last page of this paperwork for instructions on how to activate.  Billing  [x]   X-ray & Lab Orders: These are billed by separate companies. Contact the invoicing company directly for questions or concerns. [x]   Visit Charges: Discuss any billing inquiries with our administrative services team.  Your Satisfaction Matters  [x]   Share Your Experience: We strive for your satisfaction! If you have any  complaints, or preferably compliments, please let Dr. Jesus know directly or contact our Practice Administrators, Manuelita Rubin or [employer], by asking at the front desk.                 Next Steps  [x]   Schedule Follow-Up:  We recommend a follow-up appointment in 1 year for your next wellness visit.  If you develop any new problems, want to address any medical issues, or your condition worsens before then, please call us  for  an appointment or seek emergency care. [x]   Preventive Care:  Make sure to keep regular appointments with dental and vision professionals, use nightly nasal saline mist sprays to keep your sinuses clear and toothbrushing to protect your teeth. Use SnoreLab App or other app to track your sleep quality. Check blood pressure and heart rate routinely. [x]   Medical Information Release:  For any relevant medical information we don't have, please sign a release form at the front desk so we can obtain it for your records. [x]   Lab Tests:  Schedule any lab tests from today for within a week to ensure best insurance coverage.    Making the Most of Our Focused (20 minute) Appointments:  [x]   Clearly state your top concerns at the beginning of the visit to focus our discussion [x]   If you anticipate you will need more time, please inform the front desk during scheduling - we can book multiple appointments in the same week. [x]   If you have transportation problems- use our convenient video appointments or ask about transportation support. [x]   We can get down to business faster if you use MyChart to update information before the visit and submit non-urgent questions before your visit. Thank you for taking the time to provide details through MyChart.  Let our nurse know and she can import this information into your encounter documents.  Arrival and Wait Times: [x]   Arriving on time ensures that everyone receives prompt attention. [x]   Early morning (8a) and afternoon (1p) appointments tend to have shortest wait times. [x]   Unfortunately, we cannot delay appointments for late arrivals or hold slots during phone calls.  Bring to Your Next Appointment  [x]   Medications: Please bring all your medication bottles to your next appointment to ensure we have an accurate record of your prescriptions. [x]   Health Diaries: If you're monitoring any health conditions at home, keeping a diary of your readings can be very  helpful for discussions at your next appointment.  Reviewing Your Records  [x]   Review your attached preventive care information at the end of these patient instructions. [x]   Review this early draft of your clinical encounter notes below and the final encounter summary tomorrow on MyChart after its been completed.   Encounter for annual general medical examination with abnormal findings in adult -     FIB-4 W/REFLEX TO ELF -     US  Abdomen Complete; Future -     Hepatitis B surface antibody,quantitative -     Differential -     Comprehensive metabolic panel with GFR -     TSH -     Lipoprotein Fractionation, NMR with Lipid Panel (Triglycerides/HDL-C) -     PSA -     CT CARDIAC SCORING (SELF PAY ONLY); Future  Screening for cardiovascular condition  Screening for diabetes mellitus Assessment & Plan: Comprehensive Preventive Examination (Annual Wellness Visit) Performed Today.  [facility] Summary:  Adult Wellness Visit   This routine adult wellness visit focused on  preventive health measures and lifestyle modifications. A general health panel, PSA blood test, and advanced cholesterol panel were ordered. A CT cardiac calcium  score was recommended for early heart disease detection, along with a full abdominal ultrasound to follow up on splenomegaly. A hepatitis B titer and Cologuard every three years for colon cancer screening were advised. If the fib-4 test is abnormal, a liver ultrasound elastogram is recommended. Debrox was suggested for ear wax removal, and an Apple Watch for health monitoring.    Suspected fatty liver disease   Suspected due to slight visceral fat and splenomegaly, with no alcohol use reported. A fib-4 test was ordered to assess liver health, and a liver ultrasound elastogram will be considered if the fib-4 test is abnormal. ?? Core Review: Interval HPI/ROS reviewed. History, current Medications (including reconciliation), known Allergies, and Immunization Status were  thoroughly reviewed and updated. ?? Risk Assessment: Comprehensive Family, Social (including tobacco/alcohol/substance use), and Safety/Functional risks were assessed. Vitals and a complete, age-appropriate Physical Exam were fully documented. ? Preventive Planning: I reviewed all appropriate preventive screening tests (e.g., mammogram, colonoscopy, cervical cancer screening) and immunization needs (e.g., influenza, pneumococcal, Zoster). ??? Counseling & Education: Provided individualized counseling on Healthy Diet (e.g., DASH/Mediterranean), Regular Physical Activity (consistent with CDC guidelines), Sleep Hygiene, Stress Management strategies, and Mental Health screening results. ?? Conclusion: Shared decision-making was performed for all recommended interventions. Orders were placed for indicated screenings and/or vaccinations. Written educational materials were provided to reinforce today's counseling and plan.    Screening for malignant neoplasm of colon  Screening for lipoid disorders -     Lipoprotein Fractionation, NMR with Lipid Panel (Triglycerides/HDL-C)  Hyperlipidemia, acquired Assessment & Plan: Shared decision-making done; patient understood rationale and agreed to labwork  Hypertension with stage 3a chronic kidney disease   Hypertension is managed with amlodipine  and losartan , alongside stage 3a chronic kidney disease. Continue amlodipine  5 mg daily, losartan  50 mg daily, and hydrochlorothiazide  12.5 mg daily.  Hyperlipidemia   An advanced cholesterol panel was ordered for better assessment.  Splenomegaly   The etiology of splenomegaly is unclear, possibly linked to suspected fatty liver disease. No alcohol use is reported. A full abdominal ultrasound was ordered for follow-up, and a fib-4 test to assess liver health.  Orders: -     Lipoprotein Fractionation, NMR with Lipid Panel (Triglycerides/HDL-C) -     CT CARDIAC SCORING (SELF PAY ONLY); Future -     CBC with  Differential/Platelet; Future  Hypertensive nephropathy Assessment & Plan: Shared decision-making done; patient understood rationale and agreed to labwork  Hypertension with stage 3a chronic kidney disease   Hypertension is managed with amlodipine  and losartan , alongside stage 3a chronic kidney disease. Continue amlodipine  5 mg daily, losartan  50 mg daily, and hydrochlorothiazide  12.5 mg daily.  Hyperlipidemia   An advanced cholesterol panel was ordered for better assessment.  Splenomegaly   The etiology of splenomegaly is unclear, possibly linked to suspected fatty liver disease. No alcohol use is reported. A full abdominal ultrasound was ordered for follow-up, and a fib-4 test to assess liver health.  Orders: -     amLODIPine  Besylate; Take 2 tablets (10 mg total) by mouth daily. Pt is now taking 5mg   Dispense: 90 tablet; Refill: 4 -     Losartan  Potassium; Take 1 tablet (50 mg total) by mouth daily.  Dispense: 90 tablet; Refill: 4 -     hydroCHLOROthiazide ; Take 1 tablet (12.5 mg total) by mouth daily.  Dispense: 90 tablet; Refill: 4 -  CT CARDIAC SCORING (SELF PAY ONLY); Future  Stage 3a chronic kidney disease (HCC) Assessment & Plan: Shared decision-making done; patient understood rationale and agreed to labwork  Hypertension with stage 3a chronic kidney disease   Hypertension is managed with amlodipine  and losartan , alongside stage 3a chronic kidney disease. Continue amlodipine  5 mg daily, losartan  50 mg daily, and hydrochlorothiazide  12.5 mg daily.  Hyperlipidemia   An advanced cholesterol panel was ordered for better assessment.  Splenomegaly   The etiology of splenomegaly is unclear, possibly linked to suspected fatty liver disease. No alcohol use is reported. A full abdominal ultrasound was ordered for follow-up, and a fib-4 test to assess liver health.   Thrombocytopenia, idiopathic (HCC) Assessment & Plan: Shared decision-making done; patient understood rationale and  agreed to labwork  Hypertension with stage 3a chronic kidney disease   Hypertension is managed with amlodipine  and losartan , alongside stage 3a chronic kidney disease. Continue amlodipine  5 mg daily, losartan  50 mg daily, and hydrochlorothiazide  12.5 mg daily.  Hyperlipidemia   An advanced cholesterol panel was ordered for better assessment.  Splenomegaly   The etiology of splenomegaly is unclear, possibly linked to suspected fatty liver disease. No alcohol use is reported. A full abdominal ultrasound was ordered for follow-up, and a fib-4 test to assess liver health.   Splenomegaly Assessment & Plan: Shared decision-making done; patient understood rationale and agreed to labwork  Hypertension with stage 3a chronic kidney disease   Hypertension is managed with amlodipine  and losartan , alongside stage 3a chronic kidney disease. Continue amlodipine  5 mg daily, losartan  50 mg daily, and hydrochlorothiazide  12.5 mg daily.  Hyperlipidemia   An advanced cholesterol panel was ordered for better assessment.  Splenomegaly   The etiology of splenomegaly is unclear, possibly linked to suspected fatty liver disease. No alcohol use is reported. A full abdominal ultrasound was ordered for follow-up, and a fib-4 test to assess liver health.   Screening for malignant neoplasm of prostate -     PSA     Getting Answers and Following Up  [x]   Simple Questions & Concerns: For quick questions or basic follow-up after your visit, reach us  at [phone] or MyChart messaging. [x]   Complex Concerns: If your concern is more complex, scheduling an appointment might be best. Discuss this with the staff to find the most suitable option. [x]   Lab & Imaging Results: We'll contact you directly if results are abnormal or you don't use MyChart. Most normal results will be on MyChart within 2-3 business days, with a review message from Dr. Jesus. Haven't heard back in 2 weeks? Need results sooner? Contact us  at  [phone]. [x]   Referrals: Our referral coordinator will manage specialist referrals. The specialist's office should contact you within 2 weeks to schedule an appointment. Call us  if you haven't heard from them after 2 weeks.  Staying Connected  [x]   MyChart: Activate your MyChart for the fastest way to access results and message us . See the last page of this paperwork for instructions on how to activate.  Billing  [x]   X-ray & Lab Orders: These are billed by separate companies. Contact the invoicing company directly for questions or concerns. [x]   Visit Charges: Discuss any billing inquiries with our administrative services team.  Your Satisfaction Matters  [x]   Share Your Experience: We strive for your satisfaction! If you have any complaints, or preferably compliments, please let Dr. Jesus know directly or contact our Practice Administrators, Manuelita Rubin or [employer], by asking at the front  desk.    Medical Screening Exam A medical screening exam (MSE) helps to determine whether you need immediate medical treatment relating to any number of symptoms you are having. This type of exam may be done in an emergency department, an urgent care setting, or your health care provider's office. Depending on your symptoms and severity, you may need additional tests or medical therapy. It is important to note that an MSE does not necessarily mean that you will need or receive further medical testing or interventions if your symptoms are not deemed to be medically urgent (emergent). Tell a health care provider about: Any allergies you have. All medicines you are taking, including vitamins, herbs, eye drops, creams, and over-the-counter medicines. Any problems you or family members have had with anesthetic medicines. Any bleeding problems you have. Any surgeries you have had. Any medical conditions you have. Whether you are pregnant or may be pregnant. What happens during the  test? During the exam, a health care provider does a short, often focused, physical exam and asks about your medical history to assess: Your current symptoms. Your overall health. Your need for possible further medical intervention. What can I expect after the test? If you have a regular health care provider, make an appointment for a follow-up visit with him or her. If you do not have a regular health care provider, ask about resources in your community. Your medical screening exam may determine that: You do not need emergency treatment at this time. You need treatment right away. You need to be transferred to another medical center. This may happen if you need an emergent specialist or consultant that is not available at the medical center you are at. You need to have more tests. A medical specialist may be consulted if needed. Get help right away if: Your condition gets worse. You develop new or troubling symptoms before you see your health care provider. These symptoms may represent a serious problem that is an emergency. Do not wait to see if the symptoms will go away. Get medical help right away. Call your local emergency services (911 in the [place] Do not drive yourself to the hospital. Summary A medical screening exam helps to determine whether you need medical treatment right away. This type of exam may be done in an emergency department, an urgent care setting, or your health care provider's office. During the exam, a health care provider does a short physical exam and asks about your current symptoms and overall health. Depending on the exam, more tests or therapies may be ordered. However, an MSE does not necessarily mean that you will have further medical testing if your symptoms are not deemed to be urgent. If you need further care that is not offered at your current medical center, you may need to be transferred to another facility. This information is not intended to replace  advice given to you by your health care provider. Make sure you discuss any questions you have with your health care provider. Document Revised: 12/30/2020 Document Reviewed: 08/27/2020 Elsevier Patient Education  2024 [employer]        [email or web address] [facility] [address], [place], [place] [address] [phone]   VISIT SUMMARY: Joshua Wagner, a 53 year old male with hypertension and kidney disease, came in for a routine follow-up. He is currently on medications for hypertension and has a history of splenomegaly. He reports occasional blood in his stool and has a family history of heart disease. He is making efforts to improve  his diet and physical activity levels.  YOUR PLAN: -ADULT WELLNESS VISIT: This visit focused on preventive health measures and lifestyle changes. We ordered a general health panel, PSA blood test, and advanced cholesterol panel. A CT cardiac calcium  score was recommended for early heart disease detection, along with a full abdominal ultrasound to follow up on your splenomegaly. We also advised a hepatitis B titer and Cologuard test every three years for colon cancer screening. If your fib-4 test is abnormal, we will recommend a liver ultrasound elastogram. Debrox was suggested for ear wax removal, and an Apple Watch for health monitoring.  -HYPERTENSION WITH STAGE 3A CHRONIC KIDNEY DISEASE: Hypertension is high blood pressure, and stage 3a chronic kidney disease means your kidneys are moderately damaged. Continue taking amlodipine  5 mg daily, losartan  50 mg daily, and hydrochlorothiazide  12.5 mg daily.  -HYPERLIPIDEMIA: Hyperlipidemia means you have high levels of fats in your blood. We ordered an advanced cholesterol panel for better assessment.  -SPLENOMEGALY: Splenomegaly means your spleen is enlarged. The cause is unclear but may be linked to suspected fatty liver disease. We  ordered a full abdominal ultrasound for follow-up and a fib-4 test to assess liver health.  -SUSPECTED FATTY LIVER DISEASE: Fatty liver disease means there is fat buildup in your liver. This is suspected due to slight visceral fat and splenomegaly. We ordered a fib-4 test to assess liver health, and if it is abnormal, we will consider a liver ultrasound elastogram.  INSTRUCTIONS: Please follow up with the recommended tests: general health panel, PSA blood test, advanced cholesterol panel, CT cardiac calcium  score, full abdominal ultrasound, hepatitis B titer, and Cologuard test every three years. Continue taking your current medications as prescribed. If your fib-4 test is abnormal, we will recommend a liver ultrasound elastogram. Use Debrox for ear wax removal and consider using an Apple Watch for health monitoring.    Managing Your Health Over Time  Managing every aspect of your health in a single visit isn't always feasible, but that's okay.  We addressed many preventive issues today and charted a course for future care. Acute conditions or preventive care measures may require further attention.  We encourage you to schedule a follow-up visit at your earliest convenience to discuss any unresolved issues.  We strongly encourage continued participation in annual preventive care visits to help us  develop a more thorough understanding of your health and to help you maintain optimal wellness - please inquire about scheduling your next one with us  at your earliest convenience.  Next Steps  [x]   Schedule Follow-Up:  We recommend a follow-up appointment in 1 year for your next wellness visit.  If you develop any new problems, want to address any medical issues, or your condition worsens before then, please call us  for an appointment or seek emergency care. [x]   Preventive Care:  Make sure to keep regular appointments with dental and vision professionals, use nightly nasal saline mist sprays to keep your  sinuses clear and toothbrushing to protect your teeth. Use [employer] App or other app to track your sleep quality. Check blood pressure and heart rate routinely. [x]   Medical Information Release:  For any relevant medical information we don't have, please sign a release form at the front desk so we can obtain it for your records. [x]   Lab Tests:  Schedule any lab tests from today for within a week to ensure best insurance coverage.    Making the Most of Our Focused (20 minute) Appointments:  [x]   Clearly state  your top concerns at the beginning of the visit to focus our discussion [x]   If you anticipate you will need more time, please inform the front desk during scheduling - we can book multiple appointments in the same week. [x]   If you have transportation problems- use our convenient video appointments or ask about transportation support. [x]   We can get down to business faster if you use MyChart to update information before the visit and submit non-urgent questions before your visit. Thank you for taking the time to provide details through MyChart.  Let our nurse know and she can import this information into your encounter documents.  Arrival and Wait Times: [x]   Arriving on time ensures that everyone receives prompt attention. [x]   Early morning (8a) and afternoon (1p) appointments tend to have shortest wait times. [x]   Unfortunately, we cannot delay appointments for late arrivals or hold slots during phone calls.  Bring to Your Next Appointment  [x]   Medications: Please bring all your medication bottles to your next appointment to ensure we have an accurate record of your prescriptions. [x]   Health Diaries: If you're monitoring any health conditions at home, keeping a diary of your readings can be very helpful for discussions at your next appointment.  Reviewing Your Records  [x]   Review your attached preventive care information at the end of these patient instructions. [x]   Review this  early draft of your clinical encounter notes below and the final encounter summary tomorrow on MyChart after its been completed.   Encounter for annual general medical examination with abnormal findings in adult -     FIB-4 W/REFLEX TO ELF -     US  Abdomen Complete; Future -     Hepatitis B surface antibody,quantitative -     Differential -     Comprehensive metabolic panel with GFR -     TSH -     Lipoprotein Fractionation, NMR with Lipid Panel (Triglycerides/HDL-C) -     PSA -     CT CARDIAC SCORING (SELF PAY ONLY); Future  Screening for cardiovascular condition  Screening for diabetes mellitus Assessment & Plan: Comprehensive Preventive Examination (Annual Wellness Visit) Performed Today.  [facility] Summary:  Adult Wellness Visit   This routine adult wellness visit focused on preventive health measures and lifestyle modifications. A general health panel, PSA blood test, and advanced cholesterol panel were ordered. A CT cardiac calcium  score was recommended for early heart disease detection, along with a full abdominal ultrasound to follow up on splenomegaly. A hepatitis B titer and Cologuard every three years for colon cancer screening were advised. If the fib-4 test is abnormal, a liver ultrasound elastogram is recommended. Debrox was suggested for ear wax removal, and an Apple Watch for health monitoring.    Suspected fatty liver disease   Suspected due to slight visceral fat and splenomegaly, with no alcohol use reported. A fib-4 test was ordered to assess liver health, and a liver ultrasound elastogram will be considered if the fib-4 test is abnormal. ?? Core Review: Interval HPI/ROS reviewed. History, current Medications (including reconciliation), known Allergies, and Immunization Status were thoroughly reviewed and updated. ?? Risk Assessment: Comprehensive Family, Social (including tobacco/alcohol/substance use), and Safety/Functional risks were assessed. Vitals and a complete,  age-appropriate Physical Exam were fully documented. ? Preventive Planning: I reviewed all appropriate preventive screening tests (e.g., mammogram, colonoscopy, cervical cancer screening) and immunization needs (e.g., influenza, pneumococcal, Zoster). ??? Counseling & Education: Provided individualized counseling on Healthy Diet (e.g., DASH/Mediterranean), Regular Physical Activity (  consistent with CDC guidelines), Sleep Hygiene, Stress Management strategies, and Mental Health screening results. ?? Conclusion: Shared decision-making was performed for all recommended interventions. Orders were placed for indicated screenings and/or vaccinations. Written educational materials were provided to reinforce today's counseling and plan.    Screening for malignant neoplasm of colon  Screening for lipoid disorders -     Lipoprotein Fractionation, NMR with Lipid Panel (Triglycerides/HDL-C)  Hyperlipidemia, acquired Assessment & Plan: Shared decision-making done; patient understood rationale and agreed to labwork  Hypertension with stage 3a chronic kidney disease   Hypertension is managed with amlodipine  and losartan , alongside stage 3a chronic kidney disease. Continue amlodipine  5 mg daily, losartan  50 mg daily, and hydrochlorothiazide  12.5 mg daily.  Hyperlipidemia   An advanced cholesterol panel was ordered for better assessment.  Splenomegaly   The etiology of splenomegaly is unclear, possibly linked to suspected fatty liver disease. No alcohol use is reported. A full abdominal ultrasound was ordered for follow-up, and a fib-4 test to assess liver health.  Orders: -     Lipoprotein Fractionation, NMR with Lipid Panel (Triglycerides/HDL-C) -     CT CARDIAC SCORING (SELF PAY ONLY); Future -     CBC with Differential/Platelet; Future  Hypertensive nephropathy Assessment & Plan: Shared decision-making done; patient understood rationale and agreed to labwork  Hypertension with stage 3a chronic  kidney disease   Hypertension is managed with amlodipine  and losartan , alongside stage 3a chronic kidney disease. Continue amlodipine  5 mg daily, losartan  50 mg daily, and hydrochlorothiazide  12.5 mg daily.  Hyperlipidemia   An advanced cholesterol panel was ordered for better assessment.  Splenomegaly   The etiology of splenomegaly is unclear, possibly linked to suspected fatty liver disease. No alcohol use is reported. A full abdominal ultrasound was ordered for follow-up, and a fib-4 test to assess liver health.  Orders: -     amLODIPine  Besylate; Take 2 tablets (10 mg total) by mouth daily. Pt is now taking 5mg   Dispense: 90 tablet; Refill: 4 -     Losartan  Potassium; Take 1 tablet (50 mg total) by mouth daily.  Dispense: 90 tablet; Refill: 4 -     hydroCHLOROthiazide ; Take 1 tablet (12.5 mg total) by mouth daily.  Dispense: 90 tablet; Refill: 4 -     CT CARDIAC SCORING (SELF PAY ONLY); Future  Stage 3a chronic kidney disease (HCC) Assessment & Plan: Shared decision-making done; patient understood rationale and agreed to labwork  Hypertension with stage 3a chronic kidney disease   Hypertension is managed with amlodipine  and losartan , alongside stage 3a chronic kidney disease. Continue amlodipine  5 mg daily, losartan  50 mg daily, and hydrochlorothiazide  12.5 mg daily.  Hyperlipidemia   An advanced cholesterol panel was ordered for better assessment.  Splenomegaly   The etiology of splenomegaly is unclear, possibly linked to suspected fatty liver disease. No alcohol use is reported. A full abdominal ultrasound was ordered for follow-up, and a fib-4 test to assess liver health.   Thrombocytopenia, idiopathic (HCC) Assessment & Plan: Shared decision-making done; patient understood rationale and agreed to labwork  Hypertension with stage 3a chronic kidney disease   Hypertension is managed with amlodipine  and losartan , alongside stage 3a chronic kidney disease. Continue amlodipine  5 mg  daily, losartan  50 mg daily, and hydrochlorothiazide  12.5 mg daily.  Hyperlipidemia   An advanced cholesterol panel was ordered for better assessment.  Splenomegaly   The etiology of splenomegaly is unclear, possibly linked to suspected fatty liver disease. No alcohol use is reported. A full abdominal ultrasound  was ordered for follow-up, and a fib-4 test to assess liver health.   Splenomegaly Assessment & Plan: Shared decision-making done; patient understood rationale and agreed to labwork  Hypertension with stage 3a chronic kidney disease   Hypertension is managed with amlodipine  and losartan , alongside stage 3a chronic kidney disease. Continue amlodipine  5 mg daily, losartan  50 mg daily, and hydrochlorothiazide  12.5 mg daily.  Hyperlipidemia   An advanced cholesterol panel was ordered for better assessment.  Splenomegaly   The etiology of splenomegaly is unclear, possibly linked to suspected fatty liver disease. No alcohol use is reported. A full abdominal ultrasound was ordered for follow-up, and a fib-4 test to assess liver health.   Screening for malignant neoplasm of prostate -     PSA     Getting Answers and Following Up  [x]   Simple Questions & Concerns: For quick questions or basic follow-up after your visit, reach us  at [phone] or MyChart messaging. [x]   Complex Concerns: If your concern is more complex, scheduling an appointment might be best. Discuss this with the staff to find the most suitable option. [x]   Lab & Imaging Results: We'll contact you directly if results are abnormal or you don't use MyChart. Most normal results will be on MyChart within 2-3 business days, with a review message from Dr. Jesus. Haven't heard back in 2 weeks? Need results sooner? Contact us  at [phone]. [x]   Referrals: Our referral coordinator will manage specialist referrals. The specialist's office should contact you within 2 weeks to schedule an appointment. Call us  if you  haven't heard from them after 2 weeks.  Staying Connected  [x]   MyChart: Activate your MyChart for the fastest way to access results and message us . See the last page of this paperwork for instructions on how to activate.  Billing  [x]   X-ray & Lab Orders: These are billed by separate companies. Contact the invoicing company directly for questions or concerns. [x]   Visit Charges: Discuss any billing inquiries with our administrative services team.  Your Satisfaction Matters  [x]   Share Your Experience: We strive for your satisfaction! If you have any complaints, or preferably compliments, please let Dr. Jesus know directly or contact our Practice Administrators, Manuelita Rubin or [employer], by asking at the front desk.   Medical Screening Exam A medical screening exam (MSE) helps to determine whether you need immediate medical treatment relating to any number of symptoms you are having. This type of exam may be done in an emergency department, an urgent care setting, or your health care provider's office. Depending on your symptoms and severity, you may need additional tests or medical therapy. It is important to note that an MSE does not necessarily mean that you will need or receive further medical testing or interventions if your symptoms are not deemed to be medically urgent (emergent). Tell a health care provider about: Any allergies you have. All medicines you are taking, including vitamins, herbs, eye drops, creams, and over-the-counter medicines. Any problems you or family members have had with anesthetic medicines. Any bleeding problems you have. Any surgeries you have had. Any medical conditions you have. Whether you are pregnant or may be pregnant. What happens during the test? During the exam, a health care provider does a short, often focused, physical exam and asks about your medical history to assess: Your current symptoms. Your overall health. Your need for  possible further medical intervention. What can I expect after the test? If you have a regular health  care provider, make an appointment for a follow-up visit with him or her. If you do not have a regular health care provider, ask about resources in your community. Your medical screening exam may determine that: You do not need emergency treatment at this time. You need treatment right away. You need to be transferred to another medical center. This may happen if you need an emergent specialist or consultant that is not available at the medical center you are at. You need to have more tests. A medical specialist may be consulted if needed. Get help right away if: Your condition gets worse. You develop new or troubling symptoms before you see your health care provider. These symptoms may represent a serious problem that is an emergency. Do not wait to see if the symptoms will go away. Get medical help right away. Call your local emergency services (911 in the [place] Do not drive yourself to the hospital. Summary A medical screening exam helps to determine whether you need medical treatment right away. This type of exam may be done in an emergency department, an urgent care setting, or your health care provider's office. During the exam, a health care provider does a short physical exam and asks about your current symptoms and overall health. Depending on the exam, more tests or therapies may be ordered. However, an MSE does not necessarily mean that you will have further medical testing if your symptoms are not deemed to be urgent. If you need further care that is not offered at your current medical center, you may need to be transferred to another facility. This information is not intended to replace advice given to you by your health care provider. Make sure you discuss any questions you have with your health care provider. Document Revised: 12/30/2020 Document Reviewed: 08/27/2020 Elsevier  Patient Education  2024 [employer].   Now that you have been diagnosed with worsening anemia, it's important to be aware of the following symptoms that may indicate a need for immediate medical attention:  Extreme fatigue or weakness Shortness of breath Rapid or irregular heartbeat Chest pain Dizziness or lightheadedness Pale or yellowish skin Cold hands and feet Headaches  Emergency symptoms specific to anemia include: A feeling that you're going to pass out Low blood pressure Low blood oxygen saturation, which may be measured with a pulse oximeter at home3 Loss of consciousness Please advise the patient to seek emergency care if they experience any of these symptoms, or any other symptoms that cause concern.  It's always better to be safe and get checked out by a healthcare professional if you are unsure.

## 2024-04-10 ENCOUNTER — Ambulatory Visit: Payer: Self-pay | Admitting: Internal Medicine

## 2024-04-10 DIAGNOSIS — D693 Immune thrombocytopenic purpura: Secondary | ICD-10-CM

## 2024-04-10 DIAGNOSIS — I2510 Atherosclerotic heart disease of native coronary artery without angina pectoris: Secondary | ICD-10-CM

## 2024-04-10 NOTE — Progress Notes (Signed)
 The platelet count is listing as critical but its similar to where it has been for years.    Hematology wanted 6 months follow up though so encourage continue to see them.   I recommend getting elastogram of liver to get better information about how it is doing. We can order if you like, with or without a gastrointestinal referral (for enlarged spleen and suspected liver issue)

## 2024-04-11 LAB — FIB-4 W/REFLEX TO ELF
ALT: 24 IU/L (ref 0–44)
AST: 21 IU/L (ref 0–40)
FIB-4 Index: 2.97 — AB (ref 0.00–2.67)
Platelets: 75 x10E3/uL — AB (ref 150–450)

## 2024-04-11 LAB — ENHANCED LIVER FIBROSIS (ELF): ELF(TM) Score: 8.68 (ref <9.80)

## 2024-04-11 NOTE — Progress Notes (Signed)
 Pt is aware appt is dec 23

## 2024-04-11 NOTE — Progress Notes (Signed)
 read by Lyle Dryer at 9:41AM on 04/11/2024.

## 2024-04-12 LAB — TIQ- AMBIGUOUS ORDER

## 2024-04-12 LAB — LIPOPROTEIN FRACTIONATION, NMR W/ LIPID PNL
CHOL/HDL C: 3.3 calc (ref <5.0)
CHOLESTEROL, TOTAL: 143 mg/dL (ref <200)
HDL CHOLESTEROL: 44 mg/dL (ref >39)
HDL P: 28.8 umol/L — ABNORMAL LOW (ref >32.8)
HDL Size: 8.4 nm — ABNORMAL LOW (ref >9.0)
LARGE HDL P: <3 umol/L — ABNORMAL LOW (ref >7.2)
LARGE VLDL P: <1.5 nmol/L (ref <3.7)
LDL CHOLESTEROL: 80 mg/dL (ref <100)
LDL P: 1307 nmol/L — ABNORMAL HIGH (ref <935)
LDL SIZE: 20.6 nm (ref >20.5)
NON HDL CHOLESTEROL: 99 mg/dL (ref <130)
SMALL LDL P: 616 nmol/L — ABNORMAL HIGH (ref <467)
TG/HDL C: 2.5 calc — ABNORMAL HIGH (ref <2.0)
TRIGLYCERIDES: 112 mg/dL (ref <150)
VLDL Size: 44.7 nm (ref <47.1)

## 2024-04-12 LAB — HEPATITIS B SURFACE ANTIBODY, QUANTITATIVE: Hep B S AB Quant (Post): >1000 m[IU]/mL (ref >=10)

## 2024-04-23 ENCOUNTER — Ambulatory Visit (HOSPITAL_BASED_OUTPATIENT_CLINIC_OR_DEPARTMENT_OTHER)
Admission: RE | Admit: 2024-04-23 | Discharge: 2024-04-23 | Disposition: A | Discharge status: Home / Self Care | Payer: Self-pay | Source: Ambulatory Visit | Attending: Internal Medicine | Admitting: Internal Medicine

## 2024-04-23 ENCOUNTER — Ambulatory Visit (HOSPITAL_BASED_OUTPATIENT_CLINIC_OR_DEPARTMENT_OTHER)
Admission: RE | Admit: 2024-04-23 | Discharge: 2024-04-23 | Disposition: A | Discharge status: Home / Self Care | Source: Ambulatory Visit | Attending: Internal Medicine | Admitting: Internal Medicine

## 2024-04-23 DIAGNOSIS — I129 Hypertensive chronic kidney disease with stage 1 through stage 4 chronic kidney disease, or unspecified chronic kidney disease: Secondary | ICD-10-CM

## 2024-04-23 DIAGNOSIS — Z0001 Encounter for general adult medical examination with abnormal findings: Secondary | ICD-10-CM | POA: Diagnosis present

## 2024-04-23 DIAGNOSIS — E785 Hyperlipidemia, unspecified: Secondary | ICD-10-CM

## 2024-04-24 ENCOUNTER — Telehealth: Payer: Self-pay | Admitting: Internal Medicine

## 2024-04-24 NOTE — Telephone Encounter (Signed)
 After Hours: Meiko with [facility] called to clarify an order. Please call [phone]

## 2024-04-25 DIAGNOSIS — I2510 Atherosclerotic heart disease of native coronary artery without angina pectoris: Secondary | ICD-10-CM | POA: Insufficient documentation

## 2024-04-25 MED ORDER — ROSUVASTATIN CALCIUM 10 MG PO TABS: 10.0000 mg | ORAL_TABLET | Freq: Every day | ORAL | 3 refills | Status: AC

## 2024-04-25 NOTE — Progress Notes (Signed)
 Results Discussion: Reviewed coronary artery calcium  score results by patient message. Total CAC 3.21 Agatston units, 61st percentile for age and sex. While traditional ASCVD risk calculation (3.5% 10-year risk) places patient in low risk category, presence of any coronary calcium  indicates subclinical atherosclerosis. Per 2018 ACC/AHA guidelines, CAC 1-99 favors statin initiation for primary prevention, particularly when percentile elevated.  Counseling: Discussed significance of finding - early plaque formation detected before clinical symptoms. Explained that statin therapy can slow or halt progression. Reviewed risks and benefits of statin therapy. Patient understands rationale for treatment intensification despite low calculated risk.  Plan: Initiate moderate-intensity statin therapy. Will recheck fasting lipid panel in 6-8 weeks to assess response. Continue current BP management. Reinforced lifestyle optimization including Mediterranean-style diet, regular aerobic exercise, and maintaining healthy weight.

## 2024-04-26 NOTE — Telephone Encounter (Signed)
 Tried to call back spoke with another us  tech she stated the exam is done she does not see any other notes.

## 2024-05-05 NOTE — Progress Notes (Signed)
 Noted small kidney mass, coordinated with Dr. Jerrye and patient to arrange follow up.

## 2025-04-14 ENCOUNTER — Encounter: Admitting: Internal Medicine
